# Patient Record
Sex: Male | Born: 1980
Health system: Southern US, Community
[De-identification: ages and names within clinical notes are randomized; demographics above are authoritative.]

## PROBLEM LIST (undated history)

## (undated) DIAGNOSIS — G4700 Insomnia, unspecified: Secondary | ICD-10-CM

## (undated) DIAGNOSIS — F909 Attention-deficit hyperactivity disorder, unspecified type: Secondary | ICD-10-CM

## (undated) DIAGNOSIS — I10 Essential (primary) hypertension: Secondary | ICD-10-CM

## (undated) DIAGNOSIS — E785 Hyperlipidemia, unspecified: Secondary | ICD-10-CM

## (undated) DIAGNOSIS — F329 Major depressive disorder, single episode, unspecified: Secondary | ICD-10-CM

## (undated) DIAGNOSIS — F419 Anxiety disorder, unspecified: Secondary | ICD-10-CM

## (undated) DIAGNOSIS — F32A Depression, unspecified: Secondary | ICD-10-CM

## (undated) DIAGNOSIS — S82891A Other fracture of right lower leg, initial encounter for closed fracture: Secondary | ICD-10-CM

## (undated) HISTORY — PX: ORIF CLAVICLE FRACTURE: SUR924

## (undated) HISTORY — DX: Essential (primary) hypertension: I10

## (undated) HISTORY — DX: Insomnia, unspecified: G47.00

## (undated) HISTORY — DX: Anxiety disorder, unspecified: F41.9

## (undated) HISTORY — DX: Attention-deficit hyperactivity disorder, unspecified type: F90.9

## (undated) HISTORY — DX: Major depressive disorder, single episode, unspecified: F32.9

## (undated) HISTORY — DX: Hyperlipidemia, unspecified: E78.5

## (undated) HISTORY — DX: Depression, unspecified: F32.A

---

## 2003-01-17 ENCOUNTER — Encounter: Payer: Self-pay | Admitting: Family Medicine

## 2003-01-17 ENCOUNTER — Encounter: Admission: RE | Admit: 2003-01-17 | Discharge: 2003-01-17 | Payer: Self-pay | Admitting: Family Medicine

## 2005-06-21 ENCOUNTER — Emergency Department (HOSPITAL_COMMUNITY): Admission: EM | Admit: 2005-06-21 | Discharge: 2005-06-21 | Payer: Self-pay | Admitting: Emergency Medicine

## 2010-12-09 ENCOUNTER — Emergency Department (HOSPITAL_COMMUNITY)
Admission: EM | Admit: 2010-12-09 | Discharge: 2010-12-09 | Disposition: A | Discharge status: Home / Self Care | Payer: Worker's Compensation | Attending: Emergency Medicine | Admitting: Emergency Medicine

## 2010-12-09 DIAGNOSIS — W261XXA Contact with sword or dagger, initial encounter: Secondary | ICD-10-CM | POA: Insufficient documentation

## 2010-12-09 DIAGNOSIS — S61209A Unspecified open wound of unspecified finger without damage to nail, initial encounter: Secondary | ICD-10-CM | POA: Insufficient documentation

## 2010-12-09 DIAGNOSIS — W260XXA Contact with knife, initial encounter: Secondary | ICD-10-CM | POA: Insufficient documentation

## 2012-09-06 ENCOUNTER — Other Ambulatory Visit: Payer: Self-pay | Admitting: Family Medicine

## 2012-09-06 DIAGNOSIS — R1011 Right upper quadrant pain: Secondary | ICD-10-CM

## 2012-09-10 ENCOUNTER — Ambulatory Visit
Admission: RE | Admit: 2012-09-10 | Discharge: 2012-09-10 | Disposition: A | Discharge status: Home / Self Care | Payer: 59 | Source: Ambulatory Visit | Attending: Family Medicine | Admitting: Family Medicine

## 2012-09-10 DIAGNOSIS — R1011 Right upper quadrant pain: Secondary | ICD-10-CM

## 2014-06-01 ENCOUNTER — Emergency Department: Payer: Self-pay | Admitting: Emergency Medicine

## 2014-06-01 LAB — CBC WITH DIFFERENTIAL/PLATELET
BASOS PCT: 0.4 %
Basophil #: 0.1 10*3/uL (ref 0.0–0.1)
Eosinophil #: 0.1 10*3/uL (ref 0.0–0.7)
Eosinophil %: 0.5 %
HCT: 43 % (ref 40.0–52.0)
HGB: 14.4 g/dL (ref 13.0–18.0)
LYMPHS PCT: 7.2 %
Lymphocyte #: 1.6 10*3/uL (ref 1.0–3.6)
MCH: 30.1 pg (ref 26.0–34.0)
MCHC: 33.6 g/dL (ref 32.0–36.0)
MCV: 90 fL (ref 80–100)
MONOS PCT: 9 %
Monocyte #: 2 x10 3/mm — ABNORMAL HIGH (ref 0.2–1.0)
NEUTROS PCT: 82.9 %
Neutrophil #: 18 10*3/uL — ABNORMAL HIGH (ref 1.4–6.5)
PLATELETS: 201 10*3/uL (ref 150–440)
RBC: 4.79 10*6/uL (ref 4.40–5.90)
RDW: 12 % (ref 11.5–14.5)
WBC: 21.8 10*3/uL — AB (ref 3.8–10.6)

## 2014-06-01 LAB — BASIC METABOLIC PANEL
ANION GAP: 8 (ref 7–16)
BUN: 16 mg/dL (ref 7–18)
CALCIUM: 8.8 mg/dL (ref 8.5–10.1)
CREATININE: 1.12 mg/dL (ref 0.60–1.30)
Chloride: 102 mmol/L (ref 98–107)
Co2: 27 mmol/L (ref 21–32)
GLUCOSE: 114 mg/dL — AB (ref 65–99)
Osmolality: 276 (ref 275–301)
Potassium: 3.9 mmol/L (ref 3.5–5.1)
SODIUM: 137 mmol/L (ref 136–145)

## 2014-06-01 LAB — ED INFLUENZA
H1N1 flu by pcr: NOT DETECTED
INFLBPCR: NEGATIVE
Influenza A By PCR: NEGATIVE

## 2014-06-01 LAB — URINALYSIS, COMPLETE
Bacteria: NONE SEEN
Bilirubin,UR: NEGATIVE
Blood: NEGATIVE
Glucose,UR: NEGATIVE mg/dL (ref 0–75)
KETONE: NEGATIVE
LEUKOCYTE ESTERASE: NEGATIVE
Nitrite: NEGATIVE
PH: 5 (ref 4.5–8.0)
Protein: NEGATIVE
RBC,UR: <1 /HPF (ref 0–5)
SPECIFIC GRAVITY: 1.023 (ref 1.003–1.030)
Squamous Epithelial: NONE SEEN
WBC UR: 1 /HPF (ref 0–5)

## 2014-06-01 LAB — MONONUCLEOSIS SCREEN: MONO TEST: POSITIVE

## 2014-06-01 LAB — CK: CK, Total: 99 U/L (ref 39–308)

## 2014-06-01 LAB — TROPONIN I

## 2014-06-03 LAB — BETA STREP CULTURE(ARMC)

## 2014-06-06 LAB — CULTURE, BLOOD (SINGLE)

## 2015-05-17 ENCOUNTER — Emergency Department
Admission: EM | Admit: 2015-05-17 | Discharge: 2015-05-17 | Disposition: A | Discharge status: Home / Self Care | Payer: 59 | Attending: Emergency Medicine | Admitting: Emergency Medicine

## 2015-05-17 ENCOUNTER — Encounter: Payer: Self-pay | Admitting: Emergency Medicine

## 2015-05-17 DIAGNOSIS — S61012A Laceration without foreign body of left thumb without damage to nail, initial encounter: Secondary | ICD-10-CM | POA: Diagnosis present

## 2015-05-17 DIAGNOSIS — Y9389 Activity, other specified: Secondary | ICD-10-CM | POA: Diagnosis not present

## 2015-05-17 DIAGNOSIS — Y288XXA Contact with other sharp object, undetermined intent, initial encounter: Secondary | ICD-10-CM | POA: Insufficient documentation

## 2015-05-17 DIAGNOSIS — Y9289 Other specified places as the place of occurrence of the external cause: Secondary | ICD-10-CM | POA: Diagnosis not present

## 2015-05-17 DIAGNOSIS — Y998 Other external cause status: Secondary | ICD-10-CM | POA: Insufficient documentation

## 2015-05-17 MED ORDER — BACITRACIN-NEOMYCIN-POLYMYXIN 400-5-5000 EX OINT
TOPICAL_OINTMENT | CUTANEOUS | Status: AC
  Filled 2015-05-17: qty 1

## 2015-05-17 MED ORDER — LIDOCAINE HCL (PF) 1 % IJ SOLN
INTRAMUSCULAR | Status: AC
  Filled 2015-05-17: qty 5

## 2015-05-17 MED ORDER — TRIPLE ANTIBIOTIC 3.5-400-5000 EX OINT
TOPICAL_OINTMENT | Freq: Once | CUTANEOUS | Status: AC
  Administered 2015-05-17: 16:00:00 via TOPICAL

## 2015-05-17 MED ORDER — OXYCODONE-ACETAMINOPHEN 7.5-325 MG PO TABS: 1.0000 | ORAL_TABLET | Freq: Four times a day (QID) | ORAL | Status: DC | PRN

## 2015-05-17 NOTE — Discharge Instructions (Signed)
Head sutures removed by family doctor return back to the ER 10 days. Laceration Care, Adult A laceration is a cut that goes through all layers of the skin. The cut also goes into the tissue that is right under the skin. Some cuts heal on their own. Others need to be closed with stitches (sutures), staples, skin adhesive strips, or wound glue. Taking care of your cut lowers your risk of infection and helps your cut to heal better. HOW TO TAKE CARE OF YOUR CUT For stitches or staples:  Keep the wound clean and dry.  If you were given a bandage (dressing), you should change it at least one time per day or as told by your doctor. You should also change it if it gets wet or dirty.  Keep the wound completely dry for the first 24 hours or as told by your doctor. After that time, you may take a shower or a bath. However, make sure that the wound is not soaked in water until after the stitches or staples have been removed.  Clean the wound one time each day or as told by your doctor:  Wash the wound with soap and water.  Rinse the wound with water until all of the soap comes off.  Pat the wound dry with a clean towel. Do not rub the wound.  After you clean the wound, put a thin layer of antibiotic ointment on it as told by your doctor. This ointment:  Helps to prevent infection.  Keeps the bandage from sticking to the wound.  Have your stitches or staples removed as told by your doctor. If your doctor used skin adhesive strips:   Keep the wound clean and dry.  If you were given a bandage, you should change it at least one time per day or as told by your doctor. You should also change it if it gets dirty or wet.  Do not get the skin adhesive strips wet. You can take a shower or a bath, but be careful to keep the wound dry.  If the wound gets wet, pat it dry with a clean towel. Do not rub the wound.  Skin adhesive strips fall off on their own. You can trim the strips as the wound heals. Do  not remove any strips that are still stuck to the wound. They will fall off after a while. If your doctor used wound glue:  Try to keep your wound dry, but you may briefly wet it in the shower or bath. Do not soak the wound in water, such as by swimming.  After you take a shower or a bath, gently pat the wound dry with a clean towel. Do not rub the wound.  Do not do any activities that will make you really sweaty until the skin glue has fallen off on its own.  Do not apply liquid, cream, or ointment medicine to your wound while the skin glue is still on.  If you were given a bandage, you should change it at least one time per day or as told by your doctor. You should also change it if it gets dirty or wet.  If a bandage is placed over the wound, do not let the tape for the bandage touch the skin glue.  Do not pick at the glue. The skin glue usually stays on for 5-10 days. Then, it falls off of the skin. General Instructions  To help prevent scarring, make sure to cover your wound with sunscreen  whenever you are outside after stitches are removed, after adhesive strips are removed, or when wound glue stays in place and the wound is healed. Make sure to wear a sunscreen of at least 30 SPF.  Take over-the-counter and prescription medicines only as told by your doctor.  If you were given antibiotic medicine or ointment, take or apply it as told by your doctor. Do not stop using the antibiotic even if your wound is getting better.  Do not scratch or pick at the wound.  Keep all follow-up visits as told by your doctor. This is important.  Check your wound every day for signs of infection. Watch for:  Redness, swelling, or pain.  Fluid, blood, or pus.  Raise (elevate) the injured area above the level of your heart while you are sitting or lying down, if possible. GET HELP IF:  You got a tetanus shot and you have any of these problems at the injection site:  Swelling.  Very bad  pain.  Redness.  Bleeding.  You have a fever.  A wound that was closed breaks open.  You notice a bad smell coming from your wound or your bandage.  You notice something coming out of the wound, such as wood or glass.  Medicine does not help your pain.  You have more redness, swelling, or pain at the site of your wound.  You have fluid, blood, or pus coming from your wound.  You notice a change in the color of your skin near your wound.  You need to change the bandage often because fluid, blood, or pus is coming from the wound.  You start to have a new rash.  You start to have numbness around the wound. GET HELP RIGHT AWAY IF:  You have very bad swelling around the wound.  Your pain suddenly gets worse and is very bad.  You notice painful lumps near the wound or on skin that is anywhere on your body.  You have a red streak going away from your wound.  The wound is on your hand or foot and you cannot move a finger or toe like you usually can.  The wound is on your hand or foot and you notice that your fingers or toes look pale or bluish.   This information is not intended to replace advice given to you by your health care provider. Make sure you discuss any questions you have with your health care provider.   Document Released: 12/07/2007 Document Revised: 11/04/2014 Document Reviewed: 06/16/2014 Elsevier Interactive Patient Education [employer].

## 2015-05-17 NOTE — ED Notes (Signed)
Pt presents with laceration from left tip of thumb towards bottom of nail bed. No active bleeding at present.

## 2015-05-17 NOTE — ED Provider Notes (Signed)
[facility] Emergency Department Provider Note  ____________________________________________  Time seen: Approximately 3:55 PM  I have reviewed the triage vital signs and the nursing notes.   HISTORY  Chief Complaint Laceration    HPI Seth [name] is a 34 y.o. male patient presented with lacerations to left thumb was cut by a razor. He which is controlled direct pressure. Patient denies any loss sensation or loss of function of the left thumb. Patient is right-hand dominant. Pressure dressing was applied prior to arrival. Patient rates his pain discomfort as 8/10.   History reviewed. No pertinent past medical history.  There are no active problems to display for this patient.   History reviewed. No pertinent past surgical history.  Current Outpatient Rx  Name  Route  Sig  Dispense  Refill  . oxyCODONE-acetaminophen (PERCOCET) 7.5-325 MG tablet   Oral   Take 1 tablet by mouth every 6 (six) hours as needed for severe pain.   12 tablet   0     Allergies Review of patient's allergies indicates no known allergies.  No family history on file.  Social History Social History  Substance Use Topics  . Smoking status: Never Smoker   . Smokeless tobacco: None  . Alcohol Use: Yes    Review of Systems Constitutional: No fever/chills Eyes: No visual changes. ENT: No sore throat. Cardiovascular: Denies chest pain. Respiratory: Denies shortness of breath. Gastrointestinal: No abdominal pain.  No nausea, no vomiting.  No diarrhea.  No constipation. Genitourinary: Negative for dysuria. Musculoskeletal: Negative for back pain. Skin: Negative for rash. Laceration distal aspect of the left thumb. Neurological: Negative for headaches, focal weakness or numbness. 10-point ROS otherwise negative.  ____________________________________________   PHYSICAL EXAM:  VITAL SIGNS: ED Triage Vitals  Enc Vitals Group     BP 05/17/15 1340 157/108 mmHg   Pulse Rate 05/17/15 1340 73     Resp 05/17/15 1340 18     Temp 05/17/15 1340 97.3 F (36.3 C)     Temp Source 05/17/15 1340 Oral     SpO2 05/17/15 1340 95 %     Weight 05/17/15 1340 205 lb (92.987 kg)     Height 05/17/15 1340 6\' 1"  (1.854 m)     Head Cir --      Peak Flow --      Pain Score 05/17/15 1338 8     Pain Loc --      Pain Edu? --      Excl. in [place]? --     Constitutional: Alert and oriented. Well appearing and in no acute distress. Eyes: Conjunctivae are normal. PERRL. EOMI. Head: Atraumatic. Nose: No congestion/rhinnorhea. Mouth/Throat: Mucous membranes are moist.  Oropharynx non-erythematous. Neck: No stridor.  No cervical spine tenderness to palpation. Hematological/Lymphatic/Immunilogical: No cervical lymphadenopathy. Cardiovascular: Normal rate, regular rhythm. Grossly normal heart sounds.  Good peripheral circulation. Respiratory: Normal respiratory effort.  No retractions. Lungs CTAB. Gastrointestinal: Soft and nontender. No distention. No abdominal bruits. No CVA tenderness. Musculoskeletal: No lower extremity tenderness nor edema.  No joint effusions. Neurologic:  Normal speech and language. No gross focal neurologic deficits are appreciated. No gait instability. Skin:  Skin is warm, dry and intact. No rash noted. 1 cm laceration distal left thumb. Psychiatric: Mood and affect are normal. Speech and behavior are normal.  ____________________________________________   LABS (all labs ordered are listed, but only abnormal results are displayed)  Labs Reviewed - No data to display ____________________________________________  EKG   ____________________________________________  RADIOLOGY  ____________________________________________   PROCEDURES  Procedure(s) performed: See procedure note  Critical Care performed: No  ____________________________________________   INITIAL IMPRESSION / ASSESSMENT AND PLAN / ED COURSE  Pertinent labs & imaging  results that were available during my care of the patient were reviewed by me and considered in my medical decision making (see chart for details).  Laceration left thumb. Area was close with sutures. Patient advised on home care. Patient advised to have sutures removed in 10 days. Patient given Percocet as needed for pain. Return to ER if condition worsens. ____________________________________________   FINAL CLINICAL IMPRESSION(S) / ED DIAGNOSES  Final diagnoses:  Thumb laceration, left, initial encounter      Sable Feil, PA-C 05/17/15 1600  Ahmed Prima, MD 05/17/15 2242

## 2015-05-17 NOTE — ED Notes (Signed)
Pt states he cut left thumb with razor blade, pt has bandage in place at present

## 2015-12-05 ENCOUNTER — Encounter (HOSPITAL_COMMUNITY): Payer: Self-pay | Admitting: Emergency Medicine

## 2015-12-05 ENCOUNTER — Emergency Department (HOSPITAL_COMMUNITY): Payer: Worker's Compensation

## 2015-12-05 ENCOUNTER — Emergency Department (HOSPITAL_COMMUNITY)
Admission: EM | Admit: 2015-12-05 | Discharge: 2015-12-05 | Disposition: A | Discharge status: Home / Self Care | Payer: Worker's Compensation | Attending: Emergency Medicine | Admitting: Emergency Medicine

## 2015-12-05 DIAGNOSIS — Y9289 Other specified places as the place of occurrence of the external cause: Secondary | ICD-10-CM | POA: Diagnosis not present

## 2015-12-05 DIAGNOSIS — S82431A Displaced oblique fracture of shaft of right fibula, initial encounter for closed fracture: Secondary | ICD-10-CM | POA: Insufficient documentation

## 2015-12-05 DIAGNOSIS — W1839XA Other fall on same level, initial encounter: Secondary | ICD-10-CM | POA: Diagnosis not present

## 2015-12-05 DIAGNOSIS — Y9301 Activity, walking, marching and hiking: Secondary | ICD-10-CM | POA: Insufficient documentation

## 2015-12-05 DIAGNOSIS — S82401A Unspecified fracture of shaft of right fibula, initial encounter for closed fracture: Secondary | ICD-10-CM

## 2015-12-05 DIAGNOSIS — S99911A Unspecified injury of right ankle, initial encounter: Secondary | ICD-10-CM | POA: Diagnosis present

## 2015-12-05 DIAGNOSIS — Y998 Other external cause status: Secondary | ICD-10-CM | POA: Insufficient documentation

## 2015-12-05 DIAGNOSIS — S82891A Other fracture of right lower leg, initial encounter for closed fracture: Secondary | ICD-10-CM

## 2015-12-05 HISTORY — DX: Other fracture of right lower leg, initial encounter for closed fracture: S82.891A

## 2015-12-05 MED ORDER — OXYCODONE-ACETAMINOPHEN 5-325 MG PO TABS
1.0000 | ORAL_TABLET | ORAL | Status: DC | PRN
  Administered 2015-12-05: 1 via ORAL

## 2015-12-05 MED ORDER — OXYCODONE-ACETAMINOPHEN 5-325 MG PO TABS: 1.0000 | ORAL_TABLET | Freq: Four times a day (QID) | ORAL | Status: DC | PRN

## 2015-12-05 MED ORDER — OXYCODONE-ACETAMINOPHEN 5-325 MG PO TABS
ORAL_TABLET | ORAL | Status: AC
  Filled 2015-12-05: qty 1
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
## 2015-12-05 NOTE — Discharge Instructions (Signed)
You have a minimally displaced fibular fracture.  You have been placed in a Cam Walker.  This should be 1 at anytime you are up ambulating .  You can take the Cam Walker off to sleep and to bathe  You have been given a prescription for Percocet that you can take for severe pain.  He can supplement this with ibuprofen or additional Tylenol just be careful adding Tylenol .  He do not want to take more than 3 g in 24 hours each Percocet has 325 mg over-the-counter Tylenol is 325 mg each day sure to see a orthopedic specialist within the next week to help monitor healing   Fibular Ankle Fracture Treated With or Without Immobilization, Adult A fibular fracture at your ankle is a break (fracture) bone in the smallest of the two bones in your lower leg, located on the outside of your leg (fibula) close to the area at your ankle joint. CAUSES  Rolling your ankle.  Twisting your ankle.  Extreme flexing or extending of your foot.  Severe force on your ankle as when falling from a distance. RISK FACTORS  Jumping activities.  Participation in sports.  Osteoporosis.  Advanced age.  Previous ankle injuries. SIGNS AND SYMPTOMS  Pain.  Swelling.  Inability to put weight on injured ankle.  Bruising.  Bone deformities at site of injury. DIAGNOSIS  This fracture is diagnosed with the help of an X-ray exam. TREATMENT  If the fractured bone did not move out of place it usually will heal without problems and does casting or splinting. If immobilization is needed for comfort or the fractured bone moved out of place and will not heal properly with immobilization, a cast or splint will be used. HOME CARE INSTRUCTIONS   Apply ice to the area of injury:  Put ice in a plastic bag.  Place a towel between your skin and the bag.  Leave the ice on for 20 minutes, 2-3 times a day.  Use crutches as directed. Resume walking without crutches as directed by your health care provider.  Only take  over-the-counter or prescription medicines for pain, discomfort, or fever as directed by your health care provider.  If you have a removable splint or boot, do not remove the boot unless directed by your health care provider. SEEK MEDICAL CARE IF:   You have continued pain or more swelling  The medications do not control the pain. SEEK IMMEDIATE MEDICAL CARE IF:  You develop severe pain in the leg or foot.  Your skin or nails below the injury turn blue or grey or feel cold or numb. MAKE SURE YOU:   Understand these instructions.  Will watch your condition.  Will get help right away if you are not doing well or get worse.   This information is not intended to replace advice given to you by your health care provider. Make sure you discuss any questions you have with your health care provider.   Document Released: 06/20/2005 Document Revised: 07/11/2014 Document Reviewed: 01/30/2013 Elsevier Interactive Patient Education [employer].
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
## 2015-12-11 ENCOUNTER — Encounter (HOSPITAL_BASED_OUTPATIENT_CLINIC_OR_DEPARTMENT_OTHER): Payer: Self-pay | Admitting: *Deleted

## 2015-12-11 ENCOUNTER — Other Ambulatory Visit: Payer: Self-pay | Admitting: Orthopaedic Surgery

## 2015-12-16 ENCOUNTER — Ambulatory Visit (HOSPITAL_BASED_OUTPATIENT_CLINIC_OR_DEPARTMENT_OTHER)
Admission: RE | Admit: 2015-12-16 | Discharge: 2015-12-16 | Disposition: A | Discharge status: Home / Self Care | Payer: Worker's Compensation | Source: Ambulatory Visit | Attending: Orthopaedic Surgery | Admitting: Orthopaedic Surgery

## 2015-12-16 ENCOUNTER — Ambulatory Visit (HOSPITAL_COMMUNITY): Payer: Worker's Compensation

## 2015-12-16 ENCOUNTER — Encounter (HOSPITAL_BASED_OUTPATIENT_CLINIC_OR_DEPARTMENT_OTHER): Payer: Self-pay | Admitting: Certified Registered"

## 2015-12-16 ENCOUNTER — Ambulatory Visit (HOSPITAL_BASED_OUTPATIENT_CLINIC_OR_DEPARTMENT_OTHER): Payer: Worker's Compensation | Admitting: Certified Registered"

## 2015-12-16 ENCOUNTER — Encounter (HOSPITAL_BASED_OUTPATIENT_CLINIC_OR_DEPARTMENT_OTHER)
Admission: RE | Disposition: A | Discharge status: Home / Self Care | Payer: Self-pay | Source: Ambulatory Visit | Attending: Orthopaedic Surgery

## 2015-12-16 DIAGNOSIS — T148XXA Other injury of unspecified body region, initial encounter: Secondary | ICD-10-CM

## 2015-12-16 DIAGNOSIS — S8261XA Displaced fracture of lateral malleolus of right fibula, initial encounter for closed fracture: Secondary | ICD-10-CM | POA: Diagnosis not present

## 2015-12-16 DIAGNOSIS — Z886 Allergy status to analgesic agent status: Secondary | ICD-10-CM | POA: Insufficient documentation

## 2015-12-16 HISTORY — DX: Other fracture of right lower leg, initial encounter for closed fracture: S82.891A

## 2015-12-16 HISTORY — PX: ORIF ANKLE FRACTURE: SHX5408

## 2015-12-16 SURGERY — OPEN REDUCTION INTERNAL FIXATION (ORIF) ANKLE FRACTURE
Anesthesia: General | Site: Ankle | Laterality: Right

## 2015-12-16 MED ORDER — LIDOCAINE HCL (CARDIAC) 20 MG/ML IV SOLN
INTRAVENOUS | Status: DC | PRN
  Administered 2015-12-16: 30 mg via INTRAVENOUS

## 2015-12-16 MED ORDER — 0.9 % SODIUM CHLORIDE (POUR BTL) OPTIME
TOPICAL | Status: DC | PRN
  Administered 2015-12-16: 1000 mL

## 2015-12-16 MED ORDER — FENTANYL CITRATE (PF) 100 MCG/2ML IJ SOLN
50.0000 ug | INTRAMUSCULAR | Status: DC | PRN
  Administered 2015-12-16: 100 ug via INTRAVENOUS

## 2015-12-16 MED ORDER — LIDOCAINE 2% (20 MG/ML) 5 ML SYRINGE
INTRAMUSCULAR | Status: AC
  Filled 2015-12-16: qty 5

## 2015-12-16 MED ORDER — MEPERIDINE HCL 25 MG/ML IJ SOLN: 6.2500 mg | INTRAMUSCULAR | Status: DC | PRN

## 2015-12-16 MED ORDER — OXYCODONE-ACETAMINOPHEN 5-325 MG PO TABS: 1.0000 | ORAL_TABLET | ORAL | Status: DC | PRN

## 2015-12-16 MED ORDER — DEXAMETHASONE SODIUM PHOSPHATE 10 MG/ML IJ SOLN
INTRAMUSCULAR | Status: DC | PRN
  Administered 2015-12-16: 10 mg via INTRAVENOUS

## 2015-12-16 MED ORDER — MIDAZOLAM HCL 2 MG/2ML IJ SOLN
INTRAMUSCULAR | Status: AC
  Filled 2015-12-16: qty 2

## 2015-12-16 MED ORDER — ARTIFICIAL TEARS OP OINT
TOPICAL_OINTMENT | OPHTHALMIC | Status: AC
  Filled 2015-12-16: qty 3.5

## 2015-12-16 MED ORDER — ASPIRIN EC 325 MG PO TBEC
325.0000 mg | DELAYED_RELEASE_TABLET | Freq: Two times a day (BID) | ORAL | Status: DC
Start: 2015-12-16 — End: 2016-06-08

## 2015-12-16 MED ORDER — OXYCODONE HCL 5 MG PO TABS
ORAL_TABLET | ORAL | Status: AC
  Filled 2015-12-16: qty 1

## 2015-12-16 MED ORDER — BUPIVACAINE-EPINEPHRINE (PF) 0.5% -1:200000 IJ SOLN
INTRAMUSCULAR | Status: DC | PRN
  Administered 2015-12-16: 30 mL via PERINEURAL

## 2015-12-16 MED ORDER — GLYCOPYRROLATE 0.2 MG/ML IJ SOLN: 0.2000 mg | Freq: Once | INTRAMUSCULAR | Status: DC | PRN

## 2015-12-16 MED ORDER — DEXAMETHASONE SODIUM PHOSPHATE 10 MG/ML IJ SOLN
INTRAMUSCULAR | Status: AC
  Filled 2015-12-16: qty 1

## 2015-12-16 MED ORDER — HYDROMORPHONE HCL 1 MG/ML IJ SOLN
INTRAMUSCULAR | Status: AC
  Filled 2015-12-16: qty 1

## 2015-12-16 MED ORDER — ONDANSETRON HCL 4 MG/2ML IJ SOLN
INTRAMUSCULAR | Status: DC | PRN
  Administered 2015-12-16: 4 mg via INTRAVENOUS

## 2015-12-16 MED ORDER — PROPOFOL 500 MG/50ML IV EMUL
INTRAVENOUS | Status: AC
  Filled 2015-12-16: qty 50

## 2015-12-16 MED ORDER — CEFAZOLIN SODIUM-DEXTROSE 2-4 GM/100ML-% IV SOLN
INTRAVENOUS | Status: AC
  Filled 2015-12-16: qty 100

## 2015-12-16 MED ORDER — PROPOFOL 10 MG/ML IV BOLUS
INTRAVENOUS | Status: DC | PRN
  Administered 2015-12-16: 200 mg via INTRAVENOUS

## 2015-12-16 MED ORDER — SENNOSIDES-DOCUSATE SODIUM 8.6-50 MG PO TABS: 1.0000 | ORAL_TABLET | Freq: Every evening | ORAL | Status: DC | PRN

## 2015-12-16 MED ORDER — LACTATED RINGERS IV SOLN
INTRAVENOUS | Status: DC
  Administered 2015-12-16 (×2): via INTRAVENOUS

## 2015-12-16 MED ORDER — CEFAZOLIN SODIUM-DEXTROSE 2-4 GM/100ML-% IV SOLN
2.0000 g | INTRAVENOUS | Status: AC
Start: 2015-12-16 — End: 2015-12-16
  Administered 2015-12-16: 2 g via INTRAVENOUS

## 2015-12-16 MED ORDER — OXYCODONE HCL 5 MG PO TABS
5.0000 mg | ORAL_TABLET | Freq: Once | ORAL | Status: AC | PRN
  Administered 2015-12-16: 5 mg via ORAL

## 2015-12-16 MED ORDER — HYDROMORPHONE HCL 1 MG/ML IJ SOLN
0.2500 mg | INTRAMUSCULAR | Status: DC | PRN
  Administered 2015-12-16 (×2): 0.5 mg via INTRAVENOUS

## 2015-12-16 MED ORDER — OXYCODONE HCL 5 MG/5ML PO SOLN: 5.0000 mg | Freq: Once | ORAL | Status: AC | PRN

## 2015-12-16 MED ORDER — FENTANYL CITRATE (PF) 100 MCG/2ML IJ SOLN
INTRAMUSCULAR | Status: AC
  Filled 2015-12-16: qty 2

## 2015-12-16 MED ORDER — METHOCARBAMOL 750 MG PO TABS: 750.0000 mg | ORAL_TABLET | Freq: Two times a day (BID) | ORAL | Status: DC | PRN

## 2015-12-16 MED ORDER — MIDAZOLAM HCL 2 MG/2ML IJ SOLN
1.0000 mg | INTRAMUSCULAR | Status: DC | PRN
  Administered 2015-12-16 (×2): 2 mg via INTRAVENOUS

## 2015-12-16 MED ORDER — SCOPOLAMINE 1 MG/3DAYS TD PT72: 1.0000 | MEDICATED_PATCH | Freq: Once | TRANSDERMAL | Status: DC | PRN

## 2015-12-16 MED ORDER — ONDANSETRON HCL 4 MG/2ML IJ SOLN
INTRAMUSCULAR | Status: AC
  Filled 2015-12-16: qty 2

## 2015-12-16 SURGICAL SUPPLY — 76 items
BANDAGE ACE 4X5 VEL STRL LF (GAUZE/BANDAGES/DRESSINGS) IMPLANT
BANDAGE ACE 6X5 VEL STRL LF (GAUZE/BANDAGES/DRESSINGS) ×2 IMPLANT
BANDAGE ESMARK 6X9 LF (GAUZE/BANDAGES/DRESSINGS) ×1 IMPLANT
BLADE HEX COATED 2.75 (ELECTRODE) ×2 IMPLANT
BLADE SURG 15 STRL LF DISP TIS (BLADE) ×2 IMPLANT
BLADE SURG 15 STRL SS (BLADE) ×4
BNDG CMPR 9X6 STRL LF SNTH (GAUZE/BANDAGES/DRESSINGS) ×1
BNDG COHESIVE 6X5 TAN STRL LF (GAUZE/BANDAGES/DRESSINGS) ×2 IMPLANT
BNDG ESMARK 6X9 LF (GAUZE/BANDAGES/DRESSINGS) ×2
CANISTER SUCT 1200ML W/VALVE (MISCELLANEOUS) ×2 IMPLANT
COVER BACK TABLE 60X90IN (DRAPES) ×2 IMPLANT
CUFF TOURNIQUET SINGLE 34IN LL (TOURNIQUET CUFF) ×1 IMPLANT
DECANTER SPIKE VIAL GLASS SM (MISCELLANEOUS) IMPLANT
DRAPE C-ARM 42X72 X-RAY (DRAPES) ×2 IMPLANT
DRAPE C-ARMOR (DRAPES) ×2 IMPLANT
DRAPE EXTREMITY T 121X128X90 (DRAPE) ×2 IMPLANT
DRAPE IMP U-DRAPE 54X76 (DRAPES) ×2 IMPLANT
DRAPE SURG 17X23 STRL (DRAPES) ×4 IMPLANT
DRILL 2.6X122MM WL AO SHAFT (BIT) ×1 IMPLANT
DRSG PAD ABDOMINAL 8X10 ST (GAUZE/BANDAGES/DRESSINGS) ×4 IMPLANT
DURAPREP 26ML APPLICATOR (WOUND CARE) ×4 IMPLANT
ELECT REM PT RETURN 9FT ADLT (ELECTROSURGICAL) ×2
ELECTRODE REM PT RTRN 9FT ADLT (ELECTROSURGICAL) ×1 IMPLANT
GAUZE SPONGE 4X4 12PLY STRL (GAUZE/BANDAGES/DRESSINGS) ×2 IMPLANT
GAUZE XEROFORM 1X8 LF (GAUZE/BANDAGES/DRESSINGS) ×2 IMPLANT
GLOVE BIOGEL PI IND STRL 7.0 (GLOVE) IMPLANT
GLOVE BIOGEL PI IND STRL 7.5 (GLOVE) IMPLANT
GLOVE BIOGEL PI INDICATOR 7.0 (GLOVE) ×1
GLOVE BIOGEL PI INDICATOR 7.5 (GLOVE) ×1
GLOVE ECLIPSE 6.5 STRL STRAW (GLOVE) ×1 IMPLANT
GLOVE SKINSENSE NS SZ7.5 (GLOVE) ×1
GLOVE SKINSENSE STRL SZ7.5 (GLOVE) ×1 IMPLANT
GLOVE SURG SYN 7.5  E (GLOVE) ×1
GLOVE SURG SYN 7.5 E (GLOVE) ×1 IMPLANT
GLOVE SURG SYN 7.5 PF PI (GLOVE) ×1 IMPLANT
GOWN STRL REIN XL XLG (GOWN DISPOSABLE) ×2 IMPLANT
GOWN STRL REUS W/ TWL LRG LVL3 (GOWN DISPOSABLE) ×1 IMPLANT
GOWN STRL REUS W/TWL LRG LVL3 (GOWN DISPOSABLE) ×2
K-WIRE ORTHOPEDIC 1.4X150L (WIRE) ×4
KWIRE ORTHOPEDIC 1.4X150L (WIRE) IMPLANT
NEEDLE HYPO 22GX1.5 SAFETY (NEEDLE) IMPLANT
NS IRRIG 1000ML POUR BTL (IV SOLUTION) ×2 IMPLANT
PACK BASIN DAY SURGERY FS (CUSTOM PROCEDURE TRAY) ×2 IMPLANT
PAD CAST 3X4 CTTN HI CHSV (CAST SUPPLIES) IMPLANT
PAD CAST 4YDX4 CTTN HI CHSV (CAST SUPPLIES) IMPLANT
PADDING CAST COTTON 3X4 STRL (CAST SUPPLIES)
PADDING CAST COTTON 4X4 STRL (CAST SUPPLIES)
PADDING CAST COTTON 6X4 STRL (CAST SUPPLIES) ×1 IMPLANT
PADDING CAST SYN 6 (CAST SUPPLIES)
PADDING CAST SYNTHETIC 4 (CAST SUPPLIES) ×2
PADDING CAST SYNTHETIC 4X4 STR (CAST SUPPLIES) IMPLANT
PADDING CAST SYNTHETIC 6X4 NS (CAST SUPPLIES) IMPLANT
PENCIL BUTTON HOLSTER BLD 10FT (ELECTRODE) ×2 IMPLANT
PLATE FIBULA 4H (Plate) ×1 IMPLANT
SCREW BONE 18 (Screw) ×3 IMPLANT
SCREW BONE 3.5X16MM (Screw) ×1 IMPLANT
SCREW BONE NON-LCKING 3.5X12MM (Screw) ×3 IMPLANT
SHEET MEDIUM DRAPE 40X70 STRL (DRAPES) ×2 IMPLANT
SLEEVE SCD COMPRESS KNEE MED (MISCELLANEOUS) ×2 IMPLANT
SPLINT FIBERGLASS 3X35 (CAST SUPPLIES) IMPLANT
SPLINT FIBERGLASS 4X30 (CAST SUPPLIES) ×1 IMPLANT
SPONGE LAP 18X18 X RAY DECT (DISPOSABLE) ×1 IMPLANT
SPONGE LAP 4X18 X RAY DECT (DISPOSABLE) ×2 IMPLANT
SUCTION FRAZIER HANDLE 10FR (MISCELLANEOUS) ×1
SUCTION TUBE FRAZIER 10FR DISP (MISCELLANEOUS) ×1 IMPLANT
SUT ETHILON 3 0 PS 1 (SUTURE) ×2 IMPLANT
SUT VIC AB 0 CT1 27 (SUTURE)
SUT VIC AB 0 CT1 27XBRD ANBCTR (SUTURE) IMPLANT
SUT VIC AB 2-0 CT1 27 (SUTURE) ×2
SUT VIC AB 2-0 CT1 TAPERPNT 27 (SUTURE) ×1 IMPLANT
SYR BULB 3OZ (MISCELLANEOUS) ×2 IMPLANT
SYR CONTROL 10ML LL (SYRINGE) IMPLANT
TOWEL OR 17X24 6PK STRL BLUE (TOWEL DISPOSABLE) ×2 IMPLANT
TUBE CONNECTING 20X1/4 (TUBING) ×2 IMPLANT
UNDERPAD 30X30 (UNDERPADS AND DIAPERS) ×2 IMPLANT
YANKAUER SUCT BULB TIP NO VENT (SUCTIONS) ×1 IMPLANT

## 2015-12-16 NOTE — Anesthesia Postprocedure Evaluation (Signed)
Anesthesia Post Note  Patient: Seth [name]  Procedure(s) Performed: Procedure(s) (LRB): OPEN REDUCTION INTERNAL FIXATION (ORIF) RIGHT ANKLE FRACTURE (Right)  Patient location during evaluation: PACU Anesthesia Type: General Level of consciousness: awake and alert Pain management: pain level controlled Vital Signs Assessment: post-procedure vital signs reviewed and stable Respiratory status: spontaneous breathing, nonlabored ventilation and respiratory function stable Cardiovascular status: blood pressure returned to baseline and stable Postop Assessment: no signs of nausea or vomiting Anesthetic complications: no    Last Vitals:  Filed Vitals:   12/16/15 1245 12/16/15 1315  BP: 150/95 148/85  Pulse: 76 65  Temp:  36.6 C  Resp: 16 16    Last Pain:  Filed Vitals:   12/16/15 1321  PainSc: 2                  Lorilei Horan A

## 2015-12-16 NOTE — Progress Notes (Signed)
Assisted Dr. Crews with right, ultrasound guided, popliteal block. Side rails up, monitors on throughout procedure. See vital signs in flow sheet. Tolerated Procedure well. 

## 2015-12-16 NOTE — Discharge Instructions (Signed)
° ° °  1. Keep splint clean and dry °2. Elevate foot above level of the heart °3. Take aspirin to prevent blood clots °4. Take pain meds as needed °5. Strict non weight bearing to operative extremity ° ° ° ° °Regional Anesthesia Blocks ° °1. Numbness or the inability to move the "blocked" extremity may last from 3-48 hours after placement. The length of time depends on the medication injected and your individual response to the medication. If the numbness is not going away after 48 hours, call your surgeon. ° °2. The extremity that is blocked will need to be protected until the numbness is gone and the  Strength has returned. Because you cannot feel it, you will need to take extra care to avoid injury. Because it may be weak, you may have difficulty moving it or using it. You may not know what position it is in without looking at it while the block is in effect. ° °3. For blocks in the legs and feet, returning to weight bearing and walking needs to be done carefully. You will need to wait until the numbness is entirely gone and the strength has returned. You should be able to move your leg and foot normally before you try and bear weight or walk. You will need someone to be with you when you first try to ensure you do not fall and possibly risk injury. ° °4. Bruising and tenderness at the needle site are common side effects and will resolve in a few days. ° °5. Persistent numbness or new problems with movement should be communicated to the surgeon or the [facility] ([phone])/ [facility] ([phone]). ° ° ° ° ° °Post Anesthesia Home Care Instructions ° °Activity: °Get plenty of rest for the remainder of the day. A responsible adult should stay with you for 24 hours following the procedure.  °For the next 24 hours, DO NOT: °-Drive a car °-Operate machinery °-Drink alcoholic beverages °-Take any medication unless instructed by your physician °-Make any legal decisions or sign important  papers. ° °Meals: °Start with liquid foods such as gelatin or soup. Progress to regular foods as tolerated. Avoid greasy, spicy, heavy foods. If nausea and/or vomiting occur, drink only clear liquids until the nausea and/or vomiting subsides. Call your physician if vomiting continues. ° °Special Instructions/Symptoms: °Your throat may feel dry or sore from the anesthesia or the breathing tube placed in your throat during surgery. If this causes discomfort, gargle with warm salt water. The discomfort should disappear within 24 hours. ° °If you had a scopolamine patch placed behind your ear for the management of post- operative nausea and/or vomiting: ° °1. The medication in the patch is effective for 72 hours, after which it should be removed.  Wrap patch in a tissue and discard in the trash. Wash hands thoroughly with soap and water. °2. You may remove the patch earlier than 72 hours if you experience unpleasant side effects which may include dry mouth, dizziness or visual disturbances. °3. Avoid touching the patch. Wash your hands with soap and water after contact with the patch. °  ° °

## 2015-12-16 NOTE — Anesthesia Preprocedure Evaluation (Addendum)
Anesthesia Evaluation  Patient identified by MRN, date of birth, ID band Patient awake    Reviewed: Allergy & Precautions, NPO status , Patient's Chart, lab work & pertinent test results  Airway Mallampati: I  TM Distance: >3 FB Neck ROM: Full    Dental  (+) Teeth Intact, Dental Advisory Given   Pulmonary    breath sounds clear to auscultation       Cardiovascular  Rhythm:Regular Rate:Normal     Neuro/Psych    GI/Hepatic   Endo/Other    Renal/GU      Musculoskeletal   Abdominal   Peds  Hematology   Anesthesia Other Findings   Reproductive/Obstetrics                            Anesthesia Physical Anesthesia Plan  ASA: I  Anesthesia Plan: General   Post-op Pain Management: GA combined w/ Regional for post-op pain   Induction: Intravenous  Airway Management Planned: LMA  Additional Equipment:   Intra-op Plan:   Post-operative Plan: Extubation in OR  Informed Consent: I have reviewed the patients History and Physical, chart, labs and discussed the procedure including the risks, benefits and alternatives for the proposed anesthesia with the patient or authorized representative who has indicated his/her understanding and acceptance.   Dental advisory given  Plan Discussed with: CRNA, Anesthesiologist and Surgeon  Anesthesia Plan Comments:         Anesthesia Quick Evaluation

## 2015-12-16 NOTE — Anesthesia Procedure Notes (Addendum)
Anesthesia Regional Block:  Popliteal block  Pre-Anesthetic Checklist: ,, timeout performed, Correct Patient, Correct Site, Correct Laterality, Correct Procedure, Correct Position, site marked, Risks and benefits discussed,  Surgical consent,  Pre-op evaluation,  At surgeon's request and post-op pain management  Laterality: Right and Lower  Prep: chloraprep       Needles:  Injection technique: Single-shot  Needle Type: Echogenic Needle     Needle Length: 9cm 9 cm Needle Gauge: 21 and 21 G    Additional Needles:  Procedures: ultrasound guided (picture in chart) Popliteal block Narrative:  Start time: 12/16/2015 10:28 AM End time: 12/16/2015 10:33 AM Injection made incrementally with aspirations every 5 mL.  Performed by: Personally  Anesthesiologist: CREWS, DAVID   Procedure Name: LMA Insertion Date/Time: 12/16/2015 10:55 AM Performed by: Dionisios Ricci D Pre-anesthesia Checklist: Patient identified, Emergency Drugs available, Suction available and Patient being monitored Patient Re-evaluated:Patient Re-evaluated prior to inductionOxygen Delivery Method: Circle system utilized Preoxygenation: Pre-oxygenation with 100% oxygen Intubation Type: IV induction Ventilation: Mask ventilation without difficulty LMA: LMA inserted LMA Size: 5.0 Number of attempts: 1 Airway Equipment and Method: Bite block Placement Confirmation: positive ETCO2 Tube secured with: Tape Dental Injury: Teeth and Oropharynx as per pre-operative assessment       Right Popliteal block image

## 2015-12-16 NOTE — H&P (Signed)

## 2015-12-16 NOTE — Op Note (Signed)
   Date of Surgery: 12/16/2015  INDICATIONS: Mr. Hathorn is a 35 y.o.-year-old male who sustained a right ankle fracture; he was indicated for open reduction and internal fixation due to the displaced nature of the articular fracture and came to the operating room today for this procedure. The patient did consent to the procedure after discussion of the risks and benefits.  PREOPERATIVE DIAGNOSIS: right lateral malleolus ankle fracture  POSTOPERATIVE DIAGNOSIS: Same.  PROCEDURE: Open treatment of right ankle fracture with internal fixation. Lateral malleolar CPT [phone]  SURGEON: N. Eduard Roux, M.D.  ASSIST: none.  ANESTHESIA:  general, regional  TOURNIQUET TIME: 41 mins  IV FLUIDS AND URINE: See anesthesia.  ESTIMATED BLOOD LOSS: minimal mL.  IMPLANTS: [employer] Variax 4 hole distal fibula plate  COMPLICATIONS: None.  DESCRIPTION OF PROCEDURE: The patient was brought to the operating room and placed supine on the operating table.  The patient had been signed prior to the procedure and this was documented. The patient had the anesthesia placed by the anesthesiologist.  A nonsterile tourniquet was placed on the upper thigh.  The prep verification and incision time-outs were performed to confirm that this was the correct patient, site, side and location. The patient had an SCD on the opposite lower extremity. The patient did receive antibiotics prior to the incision and was re-dosed during the procedure as needed at indicated intervals.  The patient had the lower extremity prepped and draped in the standard surgical fashion.  The extremity was exsanguinated using an esmarch bandage and the tourniquet was inflated to 300 mm Hg.  A lateral incision over the distal fibula was created. Blunt dissection was performed down to the fascia. The fascia was sharply incised in line with the incision. The periosteum was elevated to expose the fracture. Organized hematoma and entrapped periosteum were removed  from the fracture site. The fracture was reduced. A posterior to anterior lag screw was placed due to the orientation of the fracture. This was confirmed under fluoroscopy. We then placed a precontoured distal fibula plate on the lateral side of the fibula. Nonlocking screws were placed through the plate into the fibula both proximally and distally each giving excellent purchase. After this was done a stress test was then performed to confirm no widening of the medial clear space an intact syndesmosis. Final x-rays were taken. The wound was thoroughly irrigated and closed in layer fashion using 0 Vicryl, 2-0 Vicryl, 3-0 nylon. Sterile dressings were applied. Patient was immobilized in a short-leg splint. Patient tolerated the procedure well and no immediate complications.  POSTOPERATIVE PLAN: Mr. Harders will remain nonweightbearing on this leg for approximately 6 weeks; Mr. Diersen will return for suture removal in 2 weeks.  He will be immobilized in a short leg splint and then transitioned to a CAM walker at his first follow up appointment.  Mr. Litterio will receive DVT prophylaxis based on other medications, activity level, and risk ratio of bleeding to thrombosis.  Azucena Cecil, MD [place] [phone] 12:01 PM

## 2015-12-16 NOTE — Transfer of Care (Signed)
Immediate Anesthesia Transfer of Care Note  Patient: Seth [name]  Procedure(s) Performed: Procedure(s): OPEN REDUCTION INTERNAL FIXATION (ORIF) RIGHT ANKLE FRACTURE (Right)  Patient Location: PACU  Anesthesia Type:GA combined with regional for post-op pain  Level of Consciousness: awake and patient cooperative  Airway & Oxygen Therapy: Patient Spontanous Breathing and Patient connected to face mask oxygen  Post-op Assessment: Report given to RN and Post -op Vital signs reviewed and stable  Post vital signs: Reviewed and stable  Last Vitals:  Filed Vitals:   12/16/15 1040 12/16/15 1045  BP:    Pulse: 58 68  Temp:    Resp: 12 14    Last Pain:  Filed Vitals:   12/16/15 1049  PainSc: 5       Patients Stated Pain Goal: 0 ([phone])  Complications: No apparent anesthesia complications

## 2015-12-16 NOTE — H&P (Signed)
    PREOPERATIVE H&P  Chief Complaint: right ankle fracture  HPI: Seth [name] is a 35 y.o. male who presents for surgical treatment of right ankle fracture.  He denies any changes in medical history.  Past Medical History  Diagnosis Date  . Ankle fracture, right 12/05/2015   Past Surgical History  Procedure Laterality Date  . Orif clavicle fracture Right    Social History   Social History  . Marital Status: Married    Spouse Name: N/A  . Number of Children: N/A  . Years of Education: N/A   Social History Main Topics  . Smoking status: Never Smoker   . Smokeless tobacco: Never Used  . Alcohol Use: Yes     Comment: occasionally  . Drug Use: No  . Sexual Activity: Not Asked   Other Topics Concern  . None   Social History Narrative   History reviewed. No pertinent family history. Allergies  Allergen Reactions  . Tramadol Itching   Prior to Admission medications   Medication Sig Start Date End Date Taking? Authorizing Provider  acetaminophen (TYLENOL) 325 MG tablet Take 650 mg by mouth every 6 (six) hours as needed.   Yes Historical Provider, MD  ibuprofen (ADVIL,MOTRIN) 200 MG tablet Take 200 mg by mouth every 6 (six) hours as needed.   Yes Historical Provider, MD  oxyCODONE-acetaminophen (PERCOCET/ROXICET) 5-325 MG tablet Take 1 tablet by mouth every 6 (six) hours as needed for moderate pain (May repeat x1 in 30 minutes prn for continued moderate pain.). 12/05/15  Yes Junius Creamer, NP     Positive ROS: All other systems have been reviewed and were otherwise negative with the exception of those mentioned in the HPI and as above.  Physical Exam: General: Alert, no acute distress Cardiovascular: No pedal edema Respiratory: No cyanosis, no use of accessory musculature GI: abdomen soft Skin: No lesions in the area of chief complaint Neurologic: Sensation intact distally Psychiatric: Patient is competent for consent with normal mood and affect Lymphatic: no  lymphedema  MUSCULOSKELETAL: exam stable  Assessment: right ankle fracture  Plan: Plan for Procedure(s): OPEN REDUCTION INTERNAL FIXATION (ORIF) RIGHT ANKLE FRACTURE  The risks benefits and alternatives were discussed with the patient including but not limited to the risks of nonoperative treatment, versus surgical intervention including infection, bleeding, nerve injury,  blood clots, cardiopulmonary complications, morbidity, mortality, among others, and they were willing to proceed.   Marianna Payment, MD   12/16/2015 6:38 AM

## 2015-12-17 ENCOUNTER — Encounter (HOSPITAL_BASED_OUTPATIENT_CLINIC_OR_DEPARTMENT_OTHER): Payer: Self-pay | Admitting: Orthopaedic Surgery

## 2016-04-12 ENCOUNTER — Ambulatory Visit (INDEPENDENT_AMBULATORY_CARE_PROVIDER_SITE_OTHER): Payer: Worker's Compensation | Admitting: Orthopaedic Surgery

## 2016-04-12 DIAGNOSIS — S8261XD Displaced fracture of lateral malleolus of right fibula, subsequent encounter for closed fracture with routine healing: Secondary | ICD-10-CM

## 2016-05-17 ENCOUNTER — Telehealth (INDEPENDENT_AMBULATORY_CARE_PROVIDER_SITE_OTHER): Payer: Self-pay | Admitting: Orthopaedic Surgery

## 2016-05-17 NOTE — Telephone Encounter (Signed)
Faxed to [phone]

## 2016-05-17 NOTE — Telephone Encounter (Signed)
Seth [name] from [facility] called needing the 04/12/16 office note faxed to her.      The fax# is [phone]   The ph# is [phone]

## 2016-05-18 ENCOUNTER — Other Ambulatory Visit (INDEPENDENT_AMBULATORY_CARE_PROVIDER_SITE_OTHER): Payer: Self-pay | Admitting: Orthopaedic Surgery

## 2016-05-25 ENCOUNTER — Encounter: Payer: Self-pay | Admitting: Sports Medicine

## 2016-05-25 ENCOUNTER — Other Ambulatory Visit: Payer: Self-pay

## 2016-05-25 ENCOUNTER — Ambulatory Visit (INDEPENDENT_AMBULATORY_CARE_PROVIDER_SITE_OTHER): Payer: 59 | Admitting: Sports Medicine

## 2016-05-25 DIAGNOSIS — M79674 Pain in right toe(s): Secondary | ICD-10-CM | POA: Diagnosis not present

## 2016-05-25 DIAGNOSIS — L600 Ingrowing nail: Secondary | ICD-10-CM

## 2016-05-25 DIAGNOSIS — B070 Plantar wart: Secondary | ICD-10-CM

## 2016-05-25 MED ORDER — AMOXICILLIN-POT CLAVULANATE 875-125 MG PO TABS: 1.0000 | ORAL_TABLET | Freq: Two times a day (BID) | ORAL | 0 refills | Status: DC

## 2016-05-25 NOTE — Progress Notes (Signed)
Subjective: Seth [name] is a 35 y.o. male patient presents to office today complaining of a painful incurvated, red, hot, swollen medial greater than lateral nail border of the first toe on the right foot. This has been present for weeks Patient has treated this by trimming admits to seeing pus. Also states that he has a bumpy lesion to the top of the right foot. Patient denies fever/chills/nausea/vomitting/any other related constitutional symptoms at this time.  There are no active problems to display for this patient.   Current Outpatient Prescriptions on File Prior to Visit  Medication Sig Dispense Refill  . acetaminophen (TYLENOL) 325 MG tablet Take 650 mg by mouth every 6 (six) hours as needed.    Marland Kitchen aspirin EC 325 MG tablet Take 1 tablet (325 mg total) by mouth 2 (two) times daily. 84 tablet 0  . ibuprofen (ADVIL,MOTRIN) 200 MG tablet Take 200 mg by mouth every 6 (six) hours as needed.    . methocarbamol (ROBAXIN) 750 MG tablet Take 1 tablet (750 mg total) by mouth 2 (two) times daily as needed for muscle spasms. 60 tablet 0  . oxyCODONE-acetaminophen (PERCOCET) 5-325 MG tablet Take 1-2 tablets by mouth every 4 (four) hours as needed for severe pain. 40 tablet 0  . oxyCODONE-acetaminophen (PERCOCET/ROXICET) 5-325 MG tablet Take 1 tablet by mouth every 6 (six) hours as needed for moderate pain (May repeat x1 in 30 minutes prn for continued moderate pain.). 20 tablet 0  . senna-docusate (SENOKOT S) 8.6-50 MG tablet Take 1 tablet by mouth at bedtime as needed. 30 tablet 1   No current facility-administered medications on file prior to visit.     Allergies  Allergen Reactions  . Tramadol Itching    Objective:  There were no vitals filed for this visit.  General: Well developed, nourished, in no acute distress, alert and oriented x3   Dermatology: Raised lesion with pinpoint capillaries suggestive of wart measures less than 0.5 cm dorsal aspect of right foot, Skin is warm, dry and  supple bilateral. Right hallux nail appears to be  severely incurvated with hyperkeratosis formation at the distal aspects of  the medial and lateral nail borders. (+) Erythema. (+) Edema. (+/-) serosanguous  drainage present. The remaining nails appear unremarkable at this time. There are no open sores, lesions or other signs of infection  present.  Vascular: Dorsalis Pedis artery and Posterior Tibial artery pedal pulses are 2/4 bilateral with immedate capillary fill time. Pedal hair growth present. No lower extremity edema.   Neruologic: Grossly intact via light touch bilateral.  Musculoskeletal: Tenderness to palpation of the wart and right hallux nail fold(s). Muscular strength within normal limits in all groups bilateral.   Assesement and Plan: Problem List Items Addressed This Visit    None    Visit Diagnoses    Ingrown nail    -  Primary   Right hallux medial>lateral   Relevant Medications   amoxicillin-clavulanate (AUGMENTIN) 875-125 MG tablet   Toe pain, right       Relevant Medications   amoxicillin-clavulanate (AUGMENTIN) 875-125 MG tablet   Plantar wart       Right dorsal foot      -Discussed treatment alternatives and plan of care; Explained permanent/temporary nail avulsion and post procedure course to patient.Patient opt for PNA right hallux - After a verbal consent, injected 3 ml of a 50:50 mixture of 2% plain  lidocaine and 0.5% plain marcaine in a normal hallux block fashion. Next, a  betadine prep  was performed. Anesthesia was tested and found to be appropriate.  The offending right hallux medial and lateral nail border was then incised from the hyponychium to the epinychium. The offending nail border was removed and cleared from the field. The area was curretted for any remaining nail or spicules. Phenol application performed and the area was then flushed with alcohol and dressed with antibiotic cream and a dry sterile dressing. -Patient was instructed to leave  the dressing intact for today and begin soaking  in a weak solution of betadine and water tomorrow. Patient was instructed to  soak for 15 minutes each day and apply neosporin and a gauze or bandaid dressing each day. -Patient was instructed to monitor the toe for signs of infection and return to office if toe becomes red, hot or swollen. -Advised ice, elevation, and tylenol or motrin if needed for pain.  -Prescribed Augmentin to take as instructed for ingrown nail with infection -Applied Cantharone to right dorsal foot wart. Advised patient on blistering reaction. Once happens apply Neosporin and Band-Aid -Patient is to return in 2 weeks for follow up care/nail check or sooner if problems arise.  Landis Martins, DPM

## 2016-05-25 NOTE — Patient Instructions (Signed)

## 2016-05-30 ENCOUNTER — Telehealth (INDEPENDENT_AMBULATORY_CARE_PROVIDER_SITE_OTHER): Payer: Self-pay | Admitting: Orthopaedic Surgery

## 2016-05-30 NOTE — Telephone Encounter (Signed)
Seth [name] called saying the company responsible for his Disability information ([employer]) informed him they never received documentation from [place] stating he has a 15% rating of disability. He's wondering if this information can be sent so it can be forwarded to the appropriate office dealing with his Worker's Comp. Please give him a phone call regarding this if needed.  Please call [name] (per [name]) at: [phone] Thank you.

## 2016-06-01 NOTE — Telephone Encounter (Signed)
Dr Erlinda Hong will fill out 25 R form    PENDING

## 2016-06-08 ENCOUNTER — Ambulatory Visit (INDEPENDENT_AMBULATORY_CARE_PROVIDER_SITE_OTHER): Payer: 59 | Admitting: Sports Medicine

## 2016-06-08 ENCOUNTER — Encounter: Payer: Self-pay | Admitting: Sports Medicine

## 2016-06-08 DIAGNOSIS — M79674 Pain in right toe(s): Secondary | ICD-10-CM

## 2016-06-08 DIAGNOSIS — Z9889 Other specified postprocedural states: Secondary | ICD-10-CM

## 2016-06-08 DIAGNOSIS — B070 Plantar wart: Secondary | ICD-10-CM

## 2016-06-08 MED ORDER — FLUOROURACIL 5 % EX CREA: TOPICAL_CREAM | Freq: Two times a day (BID) | CUTANEOUS | 0 refills | Status: DC

## 2016-06-08 NOTE — Progress Notes (Signed)
Subjective: Seth [name] is a 35 y.o. male patient seen for follow up evaluation after right hallux permanent nail avulsion performed on 05/25/2016 at the medial and lateral nail borders. Patient states that he has been soaking with Betadine with no issues. States that the toe feels much better. States that he still has a few days of his antibiotic to finish because he for missed a few days by mouth. States also that the wart area is starting to shrink can get smaller. States that he did have a blister reaction of which he applied antibiotic cream to and covered with Band-Aid as instructed. Patient denies fever/chills/nausea/vomitting/any other related constitutional symptoms at this time.  There are no active problems to display for this patient.   Current Outpatient Prescriptions on File Prior to Visit  Medication Sig Dispense Refill  . methocarbamol (ROBAXIN) 500 MG tablet TK 1 T PO 2 TO 3 XD PRF SPASMS  1   No current facility-administered medications on file prior to visit.     Allergies  Allergen Reactions  . Tramadol Itching    Objective:  There were no vitals filed for this visit.  General: Well developed, nourished, in no acute distress, alert and oriented x3   Dermatology: Raised lesion with pinpoint capillaries suggestive of wart measures less than 0.5 cm dorsal aspect of right foot, Skin is warm, dry and supple bilateral. Right hallux nailthe me bed at dial and lateral nail borders with dry granular tissue with no surrounding acute signs of infection. There are no open sores, lesions or other signs of infection  present.  Vascular: Dorsalis Pedis artery and Posterior Tibial artery pedal pulses are 2/4 bilateral with immedate capillary fill time. Pedal hair growth present. No lower extremity edema.   Neruologic: Grossly intact via light touch bilateral.  Musculoskeletal: Tenderness to palpation of the wart and  decreased tenderness to right hallux nail fold(s). Muscular  strength within normal limits in all groups bilateral.   Assesement and Plan: Problem List Items Addressed This Visit    None    Visit Diagnoses    S/P nail surgery    -  Primary   Toe pain, right       Plantar wart       Relevant Medications   fluorouracil (EFUDEX) 5 % cream     -Discussed Continued care after nail procedure. Advised patient to switch over to using Epsom salt to soak until there is no more drainage, redness or irritation coming from the right great toe. Advised patient to leave Band-Aid off and toe open to air, especially at night or in the day. Advised patient to continue with Augmentin until completed  -Applied Cantharone application #2  to right dorsal foot wart. Advised patient on blistering reaction. Once happens apply Neosporin and Band-Aid. Prescribed Efudex cream to apply twice daily to warts site covered with Band-Aid as instructed.  -Patient is to return in 4 weeks for follow up care for wart / final nail check or sooner if problems arise.  Landis Martins, DPM

## 2016-06-16 NOTE — Telephone Encounter (Signed)
Called Ms. Seth [name] so I can get fax number no answer. Left message on machine to call us back with fax number so we can go ahead and fax form to them.  If she calls back please send me a message with fax number . Thank you.

## 2016-07-06 ENCOUNTER — Ambulatory Visit: Payer: 59 | Admitting: Sports Medicine

## 2016-07-09 ENCOUNTER — Other Ambulatory Visit (INDEPENDENT_AMBULATORY_CARE_PROVIDER_SITE_OTHER): Payer: Self-pay | Admitting: Orthopaedic Surgery

## 2016-07-13 ENCOUNTER — Ambulatory Visit (INDEPENDENT_AMBULATORY_CARE_PROVIDER_SITE_OTHER): Payer: 59 | Admitting: Sports Medicine

## 2016-07-13 ENCOUNTER — Encounter: Payer: Self-pay | Admitting: Sports Medicine

## 2016-07-13 DIAGNOSIS — B070 Plantar wart: Secondary | ICD-10-CM

## 2016-07-13 DIAGNOSIS — B359 Dermatophytosis, unspecified: Secondary | ICD-10-CM

## 2016-07-13 DIAGNOSIS — Z01818 Encounter for other preprocedural examination: Secondary | ICD-10-CM

## 2016-07-13 DIAGNOSIS — Z9889 Other specified postprocedural states: Secondary | ICD-10-CM

## 2016-07-13 MED ORDER — TERBINAFINE HCL 1 % EX SOLN: CUTANEOUS | 3 refills | Status: DC

## 2016-07-13 NOTE — Progress Notes (Signed)
Subjective: Seth [name] is a 36 y.o. male patient seen for follow up evaluation after right hallux permanent nail avulsion performed on 05/25/2016 at the medial and lateral nail borders. Patient states that area feels good and he wants to discuss more treatment options for wart at right foot and also the itchy sensation he gets to the tops of both feet after wearing shoes. No other issues noted.  There are no active problems to display for this patient.   Current Outpatient Prescriptions on File Prior to Visit  Medication Sig Dispense Refill  . fluorouracil (EFUDEX) 5 % cream Apply topically 2 (two) times daily. 40 g 0  . methocarbamol (ROBAXIN) 500 MG tablet TK 1 T PO 2 TO 3 XD PRF SPASMS  1   No current facility-administered medications on file prior to visit.     Allergies  Allergen Reactions  . Tramadol Itching    Objective:  There were no vitals filed for this visit.  General: Well developed, nourished, in no acute distress, alert and oriented x3   Dermatology: Raised lesion with pinpoint capillaries suggestive of wart measures less than 0.5 cm dorsal aspect of right foot, Skin is warm, dry and supple bilateral. Right hallux nail borders healed. There are no open sores, lesions or other signs of infection present. Possible annular rash both feet suggestive of very early tinea.   Vascular: Dorsalis Pedis artery and Posterior Tibial artery pedal pulses are 2/4 bilateral with immedate capillary fill time. Pedal hair growth present. No lower extremity edema.   Neruologic: Grossly intact via light touch bilateral.  Musculoskeletal: Tenderness to palpation of the wart and  no tenderness to right hallux nail fold(s). Muscular strength within normal limits in all groups bilateral.   Assesement and Plan: Problem List Items Addressed This Visit    None    Visit Diagnoses    Pre-op exam    -  Primary   S/P nail surgery       Plantar wart       Relevant Medications   Terbinafine  HCl (LAMISIL AT SPRAY) 1 % SOLN   Tinea       Relevant Medications   Terbinafine HCl (LAMISIL AT SPRAY) 1 % SOLN     -Discussed Continued care after nail procedure. Area healed and no more treatment is needed at this time -Rx lamisil spray to use daily and recommend to change socks -Patient opt for surgical management for right foot wart. Consent obtained for Excision of wart with possible laser right foot. Pre and Post op course explained. Risks, benefits, alternatives explained. No guarantees given or implied. Surgical booking slip submitted and provided patient with Surgical packet and info for [facility]. -Dispensed surgical shoe to use post op  -Recommend light duty or 2 weeks off work while skin heals -Patient is to return after surgery or sooner if problems arise.  Landis Martins, DPM

## 2016-07-13 NOTE — Patient Instructions (Signed)
Pre-Operative Instructions  Congratulations, you have decided to take an important step to improving your quality of life.  You can be assured that the doctors of [facility] will be with you every step of the way.  1. Plan to be at the surgery center/hospital at least 1 (one) hour prior to your scheduled time unless otherwise directed by the surgical center/hospital staff.  You must have a responsible adult accompany you, remain during the surgery and drive you home.  Make sure you have directions to the surgical center/hospital and know how to get there on time. 2. For hospital based surgery you will need to obtain a history and physical form from your family physician within 1 month prior to the date of surgery- we will give you a form for you primary physician.  3. We make every effort to accommodate the date you request for surgery.  There are however, times where surgery dates or times have to be moved.  We will contact you as soon as possible if a change in schedule is required.   4. No Aspirin/Ibuprofen for one week before surgery.  If you are on aspirin, any non-steroidal anti-inflammatory medications (Mobic, Aleve, Ibuprofen) you should stop taking it 7 days prior to your surgery.  You make take Tylenol  For pain prior to surgery.  5. Medications- If you are taking daily heart and blood pressure medications, seizure, reflux, allergy, asthma, anxiety, pain or diabetes medications, make sure the surgery center/hospital is aware before the day of surgery so they may notify you which medications to take or avoid the day of surgery. 6. No food or drink after midnight the night before surgery unless directed otherwise by surgical center/hospital staff. 7. No alcoholic beverages 24 hours prior to surgery.  No smoking 24 hours prior to or 24 hours after surgery. 8. Wear loose pants or shorts- loose enough to fit over bandages, boots, and casts. 9. No slip on shoes, sneakers are best. 10. Bring  your boot with you to the surgery center/hospital.  Also bring crutches or a walker if your physician has prescribed it for you.  If you do not have this equipment, it will be provided for you after surgery. 11. If you have not been contracted by the surgery center/hospital by the day before your surgery, call to confirm the date and time of your surgery. 12. Leave-time from work may vary depending on the type of surgery you have.  Appropriate arrangements should be made prior to surgery with your employer. 13. Prescriptions will be provided immediately following surgery by your doctor.  Have these filled as soon as possible after surgery and take the medication as directed. 14. Remove nail polish on the operative foot. 15. Wash the night before surgery.  The night before surgery wash the foot and leg well with the antibacterial soap provided and water paying special attention to beneath the toenails and in between the toes.  Rinse thoroughly with water and dry well with a towel.  Perform this wash unless told not to do so by your physician.  Enclosed: 1 Ice pack (please put in freezer the night before surgery)   1 Hibiclens skin cleaner   Pre-op Instructions  If you have any questions regarding the instructions, do not hesitate to call our office.  [place]: [address] [place], [place] [address] [phone]  [place]: [address] [place], [place] [address] [phone]  [place]: [address]  [place], [place] [address] [phone]   Dr.   Norman Regal DPM, Dr. Matthew Wagoner DPM, Dr. M. Todd Hyatt DPM, Dr. Zadaya Cuadra DPM 

## 2016-07-14 ENCOUNTER — Telehealth: Payer: Self-pay | Admitting: *Deleted

## 2016-07-14 NOTE — Telephone Encounter (Signed)
"  I am calling to schedule my surgery with Dr. Cannon Kettle.  She told me to give you a call."  She can do it on January 22 or 29.  "Let's do it on the 22nd."  I will get it scheduled.  The will give you a call with an arrival time the Friday before surgery date.  You can go ahead and register with the surgical center.  "Okay, I will.  Thank you."

## 2016-07-22 ENCOUNTER — Telehealth: Payer: Self-pay | Admitting: Sports Medicine

## 2016-07-25 ENCOUNTER — Encounter: Payer: Self-pay | Admitting: Sports Medicine

## 2016-07-25 ENCOUNTER — Other Ambulatory Visit: Payer: Self-pay | Admitting: Sports Medicine

## 2016-07-25 DIAGNOSIS — B078 Other viral warts: Secondary | ICD-10-CM | POA: Diagnosis not present

## 2016-07-25 DIAGNOSIS — D492 Neoplasm of unspecified behavior of bone, soft tissue, and skin: Secondary | ICD-10-CM | POA: Diagnosis not present

## 2016-07-25 DIAGNOSIS — B070 Plantar wart: Secondary | ICD-10-CM | POA: Diagnosis not present

## 2016-07-25 MED ORDER — DOCUSATE SODIUM 100 MG PO CAPS: 100.0000 mg | ORAL_CAPSULE | Freq: Two times a day (BID) | ORAL | 0 refills | Status: DC

## 2016-07-25 NOTE — Progress Notes (Signed)
Sent to pharmacy [facility] to have if needed post op -Dr. Cannon Kettle

## 2016-07-26 ENCOUNTER — Telehealth: Payer: Self-pay | Admitting: Sports Medicine

## 2016-07-26 NOTE — Telephone Encounter (Signed)
Post op check phone call made to patient. Patient did not answer. Left voicemail with instructions for patient to call office if there are any problems or issues. Patient to follow up as scheduled in 1 week for continued post op care. -Dr. Cannon Kettle

## 2016-08-01 ENCOUNTER — Encounter: Payer: Self-pay | Admitting: Sports Medicine

## 2016-08-01 NOTE — Progress Notes (Signed)
DOS 01.22.2018 Excision (cut out wart) right foot.

## 2016-08-03 ENCOUNTER — Ambulatory Visit (INDEPENDENT_AMBULATORY_CARE_PROVIDER_SITE_OTHER): Payer: Self-pay | Admitting: Sports Medicine

## 2016-08-03 ENCOUNTER — Encounter: Payer: Self-pay | Admitting: Sports Medicine

## 2016-08-03 DIAGNOSIS — M79671 Pain in right foot: Secondary | ICD-10-CM

## 2016-08-03 DIAGNOSIS — Z9889 Other specified postprocedural states: Secondary | ICD-10-CM

## 2016-08-03 DIAGNOSIS — B070 Plantar wart: Secondary | ICD-10-CM

## 2016-08-04 NOTE — Progress Notes (Signed)
Subjective: Seth [name] is a 35 y.o. male patient seen today in office for POV #1 (DOS 07-25-16), S/P Excision of lesion/wart right foot. Patient denies pain at surgical site, denies calf pain, denies headache, chest pain, shortness of breath, nausea, vomiting, fever, or chills. Patient states that he is doing well and is only taking Ibuprofen and is back at work on desk duty. No other issues noted.   There are no active problems to display for this patient.   Current Outpatient Prescriptions on File Prior to Visit  Medication Sig Dispense Refill  . docusate sodium (COLACE) 100 MG capsule Take 1 capsule (100 mg total) by mouth 2 (two) times daily. 10 capsule 0  . fluorouracil (EFUDEX) 5 % cream Apply topically 2 (two) times daily. 40 g 0  . HYDROcodone-acetaminophen (NORCO/VICODIN) 5-325 MG tablet Take 1-2 tablets by mouth every 6 (six) hours as needed for moderate pain.    Marland Kitchen ibuprofen (ADVIL,MOTRIN) 800 MG tablet Take 800 mg by mouth every 8 (eight) hours as needed.    . methocarbamol (ROBAXIN) 500 MG tablet TK 1 T PO 2 TO 3 XD PRF SPASMS  1  . promethazine (PHENERGAN) 25 MG tablet Take 25 mg by mouth every 8 (eight) hours as needed for nausea or vomiting.    . Terbinafine HCl (LAMISIL AT SPRAY) 1 % SOLN Spray daily to feet at bedtime 125 mL 3   No current facility-administered medications on file prior to visit.     Allergies  Allergen Reactions  . Tramadol Itching    Objective: There were no vitals filed for this visit.  General: No acute distress, AAOx3  Right foot: Sutures intact with no gapping or dehiscence at surgical site, mild swelling to forefoot on right, no erythema, no warmth, no drainage, no signs of infection noted, Capillary fill time <3 seconds in all digits, gross sensation present via light touch to right foot. No pain or crepitation with range of motion right foot.  No pain with calf compression.   Assessment and Plan:  Problem List Items Addressed This Visit     None    Visit Diagnoses    S/P foot surgery, right    -  Primary   Plantar wart       Right foot pain           -Patient seen and evaluated -Applied antibiotic cream and dry sterile dressing to surgical site right foot secured with ACE wrap and stockinet  -Advised patient to make sure to keep dressings clean, dry, and intact to left/right surgical site, removing the ACE as needed  -Advised patient to continue with post-op shoe on right foot   -Advised patient to limit activity to necessity  -Work note continue desk/light duty  -Advised patient to ice and elevate as necessary  -Will plan for suture removal at next office visit. In the meantime, patient to call office if any issues or problems arise.   Landis Martins, DPM

## 2016-08-10 ENCOUNTER — Encounter: Payer: 59 | Admitting: Sports Medicine

## 2016-09-07 ENCOUNTER — Telehealth: Payer: Self-pay | Admitting: *Deleted

## 2016-09-07 NOTE — Telephone Encounter (Addendum)
-----   Message from Landis Martins, [place] sent at 09/07/2016  8:13 AM EST ----- Regarding: Check on patient  I haven't seen patient since POV #1. Please check on patient. Sutures were nonabsorable and are overdue to be removed Thanks Dr. Cannon Kettle. Left message for pt to make an appt to see Dr. Cannon Kettle and to have sutures removed. Pt states everything looks fine, and his wife the nurse took his stitches out, will make an appt if Dr. Cannon Kettle really needs him to. I spoke with pt and thanked him for letting us know he was okay and to call if he needed anything.

## 2016-09-07 NOTE — Telephone Encounter (Signed)
Ok thank you I just wanted to make sure things were ok.  He should come in if there are any problems -Dr. Cannon Kettle

## 2016-10-18 DIAGNOSIS — M7712 Lateral epicondylitis, left elbow: Secondary | ICD-10-CM | POA: Diagnosis not present

## 2016-10-18 DIAGNOSIS — M25522 Pain in left elbow: Secondary | ICD-10-CM | POA: Diagnosis not present

## 2016-10-18 DIAGNOSIS — M25532 Pain in left wrist: Secondary | ICD-10-CM | POA: Diagnosis not present

## 2016-10-19 DIAGNOSIS — M7712 Lateral epicondylitis, left elbow: Secondary | ICD-10-CM | POA: Diagnosis not present

## 2016-10-19 DIAGNOSIS — M25639 Stiffness of unspecified wrist, not elsewhere classified: Secondary | ICD-10-CM | POA: Diagnosis not present

## 2016-10-19 DIAGNOSIS — R29898 Other symptoms and signs involving the musculoskeletal system: Secondary | ICD-10-CM | POA: Diagnosis not present

## 2016-10-19 DIAGNOSIS — M79632 Pain in left forearm: Secondary | ICD-10-CM | POA: Diagnosis not present

## 2016-10-28 DIAGNOSIS — M7712 Lateral epicondylitis, left elbow: Secondary | ICD-10-CM | POA: Diagnosis not present

## 2016-10-28 DIAGNOSIS — R29898 Other symptoms and signs involving the musculoskeletal system: Secondary | ICD-10-CM | POA: Diagnosis not present

## 2016-10-28 DIAGNOSIS — M79632 Pain in left forearm: Secondary | ICD-10-CM | POA: Diagnosis not present

## 2016-10-28 DIAGNOSIS — M25639 Stiffness of unspecified wrist, not elsewhere classified: Secondary | ICD-10-CM | POA: Diagnosis not present

## 2016-11-10 ENCOUNTER — Other Ambulatory Visit: Payer: Self-pay | Admitting: Sports Medicine

## 2016-11-17 DIAGNOSIS — M25522 Pain in left elbow: Secondary | ICD-10-CM | POA: Diagnosis not present

## 2016-11-17 DIAGNOSIS — M7712 Lateral epicondylitis, left elbow: Secondary | ICD-10-CM | POA: Diagnosis not present

## 2016-11-17 DIAGNOSIS — M25532 Pain in left wrist: Secondary | ICD-10-CM | POA: Diagnosis not present

## 2017-03-02 DIAGNOSIS — M542 Cervicalgia: Secondary | ICD-10-CM | POA: Diagnosis not present

## 2017-04-28 DIAGNOSIS — E782 Mixed hyperlipidemia: Secondary | ICD-10-CM | POA: Diagnosis not present

## 2017-04-28 DIAGNOSIS — Z23 Encounter for immunization: Secondary | ICD-10-CM | POA: Diagnosis not present

## 2017-05-30 DIAGNOSIS — E782 Mixed hyperlipidemia: Secondary | ICD-10-CM | POA: Diagnosis not present

## 2017-06-21 ENCOUNTER — Ambulatory Visit: Payer: Self-pay | Admitting: Licensed Clinical Social Worker

## 2017-07-10 ENCOUNTER — Ambulatory Visit: Payer: 59 | Admitting: Licensed Clinical Social Worker

## 2017-07-10 DIAGNOSIS — F321 Major depressive disorder, single episode, moderate: Secondary | ICD-10-CM | POA: Diagnosis not present

## 2017-07-10 DIAGNOSIS — F411 Generalized anxiety disorder: Secondary | ICD-10-CM
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
## 2017-07-25 ENCOUNTER — Encounter: Payer: Self-pay | Admitting: Psychiatry

## 2017-07-25 ENCOUNTER — Ambulatory Visit: Payer: 59 | Admitting: Psychiatry

## 2017-07-25 ENCOUNTER — Other Ambulatory Visit: Payer: Self-pay

## 2017-07-25 VITALS — BP 185/110 | HR 105 | Temp 97.7°F | Wt 214.4 lb

## 2017-07-25 DIAGNOSIS — F4310 Post-traumatic stress disorder, unspecified: Secondary | ICD-10-CM | POA: Diagnosis not present

## 2017-07-25 DIAGNOSIS — F330 Major depressive disorder, recurrent, mild: Secondary | ICD-10-CM | POA: Diagnosis not present

## 2017-07-25 MED ORDER — HYDROXYZINE PAMOATE 25 MG PO CAPS: 25.0000 mg | ORAL_CAPSULE | Freq: Two times a day (BID) | ORAL | 1 refills | Status: DC | PRN

## 2017-07-25 MED ORDER — PRAZOSIN HCL 1 MG PO CAPS: 1.0000 mg | ORAL_CAPSULE | Freq: Every day | ORAL | 1 refills | Status: DC

## 2017-07-25 MED ORDER — TRAZODONE HCL 50 MG PO TABS: 50.0000 mg | ORAL_TABLET | Freq: Every evening | ORAL | 1 refills | Status: DC | PRN

## 2017-07-25 NOTE — Patient Instructions (Signed)
Hydroxyzine capsules or tablets What is this medicine? HYDROXYZINE (hye [place] i zeen) is an antihistamine. This medicine is used to treat allergy symptoms. It is also used to treat anxiety and tension. This medicine can be used with other medicines to induce sleep before surgery. This medicine may be used for other purposes; ask your health care provider or pharmacist if you have questions. COMMON BRAND NAME(S): ANX, Atarax, Rezine, Vistaril What should I tell my health care provider before I take this medicine? They need to know if you have any of these conditions: -any chronic illness -difficulty passing urine -glaucoma -heart disease -kidney disease -liver disease -lung disease -an unusual or allergic reaction to hydroxyzine, cetirizine, other medicines, foods, dyes, or preservatives -pregnant or trying to get pregnant -breast-feeding How should I use this medicine? Take this medicine by mouth with a full glass of water. Follow the directions on the prescription label. You may take this medicine with food or on an empty stomach. Take your medicine at regular intervals. Do not take your medicine more often than directed. Talk to your pediatrician regarding the use of this medicine in children. Special care may be needed. While this drug may be prescribed for children as young as 75 years of age for selected conditions, precautions do apply. Patients over 62 years old may have a stronger reaction and need a smaller dose. Overdosage: If you think you have taken too much of this medicine contact a poison control center or emergency room at once. NOTE: This medicine is only for you. Do not share this medicine with others. What if I miss a dose? If you miss a dose, take it as soon as you can. If it is almost time for your next dose, take only that dose. Do not take double or extra doses. What may interact with this medicine? -alcohol -barbiturate medicines for sleep or seizures -medicines for  colds, allergies -medicines for depression, anxiety, or emotional disturbances -medicines for pain -medicines for sleep -muscle relaxants This list may not describe all possible interactions. Give your health care provider a list of all the medicines, herbs, non-prescription drugs, or dietary supplements you use. Also tell them if you smoke, drink alcohol, or use illegal drugs. Some items may interact with your medicine. What should I watch for while using this medicine? Tell your doctor or health care professional if your symptoms do not improve. You may get drowsy or dizzy. Do not drive, use machinery, or do anything that needs mental alertness until you know how this medicine affects you. Do not stand or sit up quickly, especially if you are an older patient. This reduces the risk of dizzy or fainting spells. Alcohol may interfere with the effect of this medicine. Avoid alcoholic drinks. Your mouth may get dry. Chewing sugarless gum or sucking hard candy, and drinking plenty of water may help. Contact your doctor if the problem does not go away or is severe. This medicine may cause dry eyes and blurred vision. If you wear contact lenses you may feel some discomfort. Lubricating drops may help. See your eye doctor if the problem does not go away or is severe. If you are receiving skin tests for allergies, tell your doctor you are using this medicine. What side effects may I notice from receiving this medicine? Side effects that you should report to your doctor or health care professional as soon as possible: -fast or irregular heartbeat -difficulty passing urine -seizures -slurred speech or confusion -tremor Side effects that  usually do not require medical attention (report to your doctor or health care professional if they continue or are bothersome): -constipation -drowsiness -fatigue -headache -stomach upset This list may not describe all possible side effects. Call your doctor for  medical advice about side effects. You may report side effects to FDA at 1-800-FDA-1088. Where should I keep my medicine? Keep out of the reach of children. Store at room temperature between 15 and 30 degrees C (59 and 86 degrees F). Keep container tightly closed. Throw away any unused medicine after the expiration date. NOTE: This sheet is a summary. It may not cover all possible information. If you have questions about this medicine, talk to your doctor, pharmacist, or health care provider.  2018 [employer]/Gold Standard (2007-11-02 14:50:59) Trazodone tablets What is this medicine? TRAZODONE (TRAZ oh done) is used to treat depression. This medicine may be used for other purposes; ask your health care provider or pharmacist if you have questions. COMMON BRAND NAME(S): Desyrel What should I tell my health care provider before I take this medicine? They need to know if you have any of these conditions: -attempted suicide or thinking about it -bipolar disorder -bleeding problems -glaucoma -heart disease, or previous heart attack -irregular heart beat -kidney or liver disease -low levels of sodium in the blood -an unusual or allergic reaction to trazodone, other medicines, foods, dyes or preservatives -pregnant or trying to get pregnant -breast-feeding How should I use this medicine? Take this medicine by mouth with a glass of water. Follow the directions on the prescription label. Take this medicine shortly after a meal or a light snack. Take your medicine at regular intervals. Do not take your medicine more often than directed. Do not stop taking this medicine suddenly except upon the advice of your doctor. Stopping this medicine too quickly may cause serious side effects or your condition may worsen. A special MedGuide will be given to you by the pharmacist with each prescription and refill. Be sure to read this information carefully each time. Talk to your pediatrician regarding the use  of this medicine in children. Special care may be needed. Overdosage: If you think you have taken too much of this medicine contact a poison control center or emergency room at once. NOTE: This medicine is only for you. Do not share this medicine with others. What if I miss a dose? If you miss a dose, take it as soon as you can. If it is almost time for your next dose, take only that dose. Do not take double or extra doses. What may interact with this medicine? Do not take this medicine with any of the following medications: -certain medicines for fungal infections like fluconazole, itraconazole, ketoconazole, posaconazole, voriconazole -cisapride -dofetilide -dronedarone -linezolid -MAOIs like Carbex, Eldepryl, Marplan, Nardil, and Parnate -mesoridazine -methylene blue (injected into a vein) -pimozide -saquinavir -thioridazine -ziprasidone This medicine may also interact with the following medications: -alcohol -antiviral medicines for HIV or AIDS -aspirin and aspirin-like medicines -barbiturates like phenobarbital -certain medicines for blood pressure, heart disease, irregular heart beat -certain medicines for depression, anxiety, or psychotic disturbances -certain medicines for migraine headache like almotriptan, eletriptan, frovatriptan, naratriptan, rizatriptan, sumatriptan, zolmitriptan -certain medicines for seizures like carbamazepine and phenytoin -certain medicines for sleep -certain medicines that treat or prevent blood clots like dalteparin, enoxaparin, warfarin -digoxin -fentanyl -lithium -NSAIDS, medicines for pain and inflammation, like ibuprofen or naproxen -other medicines that prolong the QT interval (cause an abnormal heart rhythm) -rasagiline -supplements like St. John's wort, kava kava,  valerian -tramadol -tryptophan This list may not describe all possible interactions. Give your health care provider a list of all the medicines, herbs, non-prescription  drugs, or dietary supplements you use. Also tell them if you smoke, drink alcohol, or use illegal drugs. Some items may interact with your medicine. What should I watch for while using this medicine? Tell your doctor if your symptoms do not get better or if they get worse. Visit your doctor or health care professional for regular checks on your progress. Because it may take several weeks to see the full effects of this medicine, it is important to continue your treatment as prescribed by your doctor. Patients and their families should watch out for new or worsening thoughts of suicide or depression. Also watch out for sudden changes in feelings such as feeling anxious, agitated, panicky, irritable, hostile, aggressive, impulsive, severely restless, overly excited and hyperactive, or not being able to sleep. If this happens, especially at the beginning of treatment or after a change in dose, call your health care professional. Dennis Bast may get drowsy or dizzy. Do not drive, use machinery, or do anything that needs mental alertness until you know how this medicine affects you. Do not stand or sit up quickly, especially if you are an older patient. This reduces the risk of dizzy or fainting spells. Alcohol may interfere with the effect of this medicine. Avoid alcoholic drinks. This medicine may cause dry eyes and blurred vision. If you wear contact lenses you may feel some discomfort. Lubricating drops may help. See your eye doctor if the problem does not go away or is severe. Your mouth may get dry. Chewing sugarless gum, sucking hard candy and drinking plenty of water may help. Contact your doctor if the problem does not go away or is severe. What side effects may I notice from receiving this medicine? Side effects that you should report to your doctor or health care professional as soon as possible: -allergic reactions like skin rash, itching or hives, swelling of the face, lips, or tongue -elevated mood,  decreased need for sleep, racing thoughts, impulsive behavior -confusion -fast, irregular heartbeat -feeling faint or lightheaded, falls -feeling agitated, angry, or irritable -loss of balance or coordination -painful or prolonged erections -restlessness, pacing, inability to keep still -suicidal thoughts or other mood changes -tremors -trouble sleeping -seizures -unusual bleeding or bruising Side effects that usually do not require medical attention (report to your doctor or health care professional if they continue or are bothersome): -change in sex drive or performance -change in appetite or weight -constipation -headache -muscle aches or pains -nausea This list may not describe all possible side effects. Call your doctor for medical advice about side effects. You may report side effects to FDA at 1-800-FDA-1088. Where should I keep my medicine? Keep out of the reach of children. Store at room temperature between 15 and 30 degrees C (59 to 86 degrees F). Protect from light. Keep container tightly closed. Throw away any unused medicine after the expiration date. NOTE: This sheet is a summary. It may not cover all possible information. If you have questions about this medicine, talk to your doctor, pharmacist, or health care provider.  2018 [employer]/Gold Standard (2015-11-19 16:57:05) Prazosin capsules What is this medicine? PRAZOSIN (PRA zoe sin) is an antihypertensive. It works by relaxing the blood vessels. It is used to treat high blood pressure. This medicine may be used for other purposes; ask your health care provider or pharmacist if you have questions. COMMON BRAND  NAME(S): Minipress What should I tell my health care provider before I take this medicine? They need to know if you have any of the following conditions: -kidney disease -an unusual or allergic reaction to prazosin, other medicines, foods, dyes, or preservatives -pregnant or trying to get  pregnant -breast-feeding How should I use this medicine? Take this medicine by mouth with a glass of water. Follow the directions on the prescription label. Take your doses at regular intervals. Do not take your medicine more often than directed. Do not stop taking except on the advice of your doctor or health care professional. Talk to your pediatrician regarding the use of this medicine in children. Special care may be needed. Overdosage: If you think you have taken too much of this medicine contact a poison control center or emergency room at once. NOTE: This medicine is only for you. Do not share this medicine with others. What if I miss a dose? If you miss a dose, take it as soon as you can. If it is almost time for your next dose, take only that dose. Do not take double or extra doses. What may interact with this medicine? -diuretics -medicines for high blood pressure -sildenafil -tadalafil -vardenafil This list may not describe all possible interactions. Give your health care provider a list of all the medicines, herbs, non-prescription drugs, or dietary supplements you use. Also tell them if you smoke, drink alcohol, or use illegal drugs. Some items may interact with your medicine. What should I watch for while using this medicine? Visit your doctor or health care professional for regular checks on your progress. Check your blood pressure regularly. Ask your doctor or health care professional what your blood pressure should be and when you should contact him or her. Drowsiness and dizziness are more likely to occur after the first dose, after an increase in dose, or during hot weather or exercise. These effects can decrease once your body adjusts to this medicine. Do not drive, use machinery, or do anything that needs mental alertness until you know how this drug affects you. Do not stand or sit up quickly, especially if you are an older patient. This reduces the risk of dizzy or fainting  spells. Alcohol can make you more drowsy and dizzy. Avoid alcoholic drinks. Do not treat yourself for coughs, colds or allergies without asking your doctor or health care professional for advice. Some ingredients can increase your blood pressure. Your mouth may get dry. Chewing sugarless gum or sucking hard candy, and drinking plenty of water may help. Contact your doctor if the problem does not go away or is severe. For males, contact your doctor or health care professional right away if you have an erection that lasts longer than 4 hours or if it becomes painful. This may be a sign of a serious problem and must be treated right away to prevent permanent damage. What side effects may I notice from receiving this medicine? Side effects that you should report to your doctor or health care professional as soon as possible: -blurred vision -difficulty breathing, shortness of breath -fainting spells, light headedness -fast or irregular heartbeat, palpitations or chest pain -numbness in hands or feet -prolonged painful erection of the penis -swelling of the legs or ankles -unusually weak or tired Side effects that usually do not require medical attention (report to your doctor or health care professional if they continue or are bothersome): -constipation or diarrhea -headache -nausea -sexual difficulties -stomach pain This list may not describe  all possible side effects. Call your doctor for medical advice about side effects. You may report side effects to FDA at 1-800-FDA-1088. Where should I keep my medicine? Keep out of the reach of children. Store at room temperature between 15 and 30 degrees C (59 and 86 degrees F). Protect from light. Keep container tightly closed. Throw away any unused medicine after the expiration date. NOTE: This sheet is a summary. It may not cover all possible information. If you have questions about this medicine, talk to your doctor, pharmacist, or health care  provider.  2018 [employer]/Gold Standard (2013-12-16 09:13:50)
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
## 2017-07-26 ENCOUNTER — Encounter: Payer: Self-pay | Admitting: Psychiatry

## 2017-07-31 ENCOUNTER — Ambulatory Visit: Payer: 59 | Admitting: Licensed Clinical Social Worker

## 2017-08-21 ENCOUNTER — Ambulatory Visit: Payer: 59 | Admitting: Psychiatry

## 2017-08-21 ENCOUNTER — Other Ambulatory Visit: Payer: Self-pay

## 2017-08-21 ENCOUNTER — Encounter: Payer: Self-pay | Admitting: Psychiatry

## 2017-08-21 VITALS — BP 135/87 | HR 88 | Temp 97.7°F | Wt 220.8 lb

## 2017-08-21 DIAGNOSIS — F330 Major depressive disorder, recurrent, mild: Secondary | ICD-10-CM

## 2017-08-21 DIAGNOSIS — F4310 Post-traumatic stress disorder, unspecified: Secondary | ICD-10-CM

## 2017-08-21 MED ORDER — TRAZODONE HCL 50 MG PO TABS: 50.0000 mg | ORAL_TABLET | Freq: Every evening | ORAL | 0 refills | Status: DC | PRN

## 2017-08-21 MED ORDER — PRAZOSIN HCL 1 MG PO CAPS: 1.0000 mg | ORAL_CAPSULE | Freq: Every day | ORAL | 0 refills | Status: DC

## 2017-08-21 MED ORDER — SERTRALINE HCL 50 MG PO TABS: 75.0000 mg | ORAL_TABLET | Freq: Every day | ORAL | 0 refills | Status: DC

## 2017-08-21 MED ORDER — BUPROPION HCL ER (XL) 150 MG PO TB24: 150.0000 mg | ORAL_TABLET | Freq: Every day | ORAL | 0 refills | Status: DC

## 2017-08-21 MED ORDER — HYDROXYZINE PAMOATE 25 MG PO CAPS: 25.0000 mg | ORAL_CAPSULE | Freq: Two times a day (BID) | ORAL | 0 refills | Status: DC | PRN
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
## 2017-08-28 ENCOUNTER — Ambulatory Visit: Payer: 59 | Admitting: Licensed Clinical Social Worker

## 2017-08-28 DIAGNOSIS — E782 Mixed hyperlipidemia: Secondary | ICD-10-CM | POA: Diagnosis not present

## 2017-08-28 DIAGNOSIS — Z0000 Encounter for general adult medical examination without abnormal findings: Secondary | ICD-10-CM | POA: Diagnosis not present

## 2017-08-28 DIAGNOSIS — F4310 Post-traumatic stress disorder, unspecified: Secondary | ICD-10-CM | POA: Diagnosis not present

## 2017-08-28 DIAGNOSIS — F330 Major depressive disorder, recurrent, mild: Secondary | ICD-10-CM | POA: Diagnosis not present

## 2017-08-28 DIAGNOSIS — Z79899 Other long term (current) drug therapy: Secondary | ICD-10-CM | POA: Diagnosis not present

## 2017-09-01 NOTE — Progress Notes (Signed)
  THERAPIST PROGRESS NOTE   Date of Service:   08/28/2017  Session Time:   1hour  Patient:   AYDIEN MAJETTE   DOB:   08-24-1980  MR Number:  [id number]  Location:  [facility] [place] [address] Dept: [phone]            Provider/Observer:  Lubertha South Counselor  Risk of Suicide/Violence: virtually non-existent   Diagnosis:    MDD (major depressive disorder), recurrent episode, mild (HCC)  PTSD (post-traumatic stress disorder)  Type of Therapy: Individual Therapy  Treatment Goals addressed: Anxiety  Participation Level: Active   Interventions: CBT and Motivational Interviewing   Behavioral Response: CasualAlertAnxious   Summary:  Patient was interactive throughout the session.  Patient reports that he continues to thrash while asleep.  He denies any nightmares and reports that he is not apprehensive about going to work.  He reports that he was able to create a support group of people that he is able to communicate with about his past trauma.  He reports being able to talk about his experience has assisted him with recovery.  With the assistance of the therapist he was able to create a verbal list of coping strategies to use while out in the community with his family.  He was able to add about 5 coping skills to the list.  Therapist introduced grounding with therapist and discussed when and how to use the technique    Plan: KARSIN PESTA will continue to use coping skills   Return again in 2 weeks.

## 2017-09-15 ENCOUNTER — Telehealth: Payer: Self-pay

## 2017-09-15 DIAGNOSIS — F4310 Post-traumatic stress disorder, unspecified: Secondary | ICD-10-CM

## 2017-09-15 DIAGNOSIS — F330 Major depressive disorder, recurrent, mild: Secondary | ICD-10-CM

## 2017-09-15 MED ORDER — BUPROPION HCL ER (XL) 150 MG PO TB24: 150.0000 mg | ORAL_TABLET | Freq: Every day | ORAL | 0 refills | Status: DC

## 2017-09-15 MED ORDER — TRAZODONE HCL 50 MG PO TABS: 50.0000 mg | ORAL_TABLET | Freq: Every evening | ORAL | 0 refills | Status: DC | PRN

## 2017-09-15 MED ORDER — PRAZOSIN HCL 1 MG PO CAPS: 1.0000 mg | ORAL_CAPSULE | Freq: Every day | ORAL | 0 refills | Status: DC

## 2017-09-15 MED ORDER — HYDROXYZINE PAMOATE 25 MG PO CAPS: 25.0000 mg | ORAL_CAPSULE | Freq: Two times a day (BID) | ORAL | 0 refills | Status: DC | PRN

## 2017-09-15 MED ORDER — SERTRALINE HCL 50 MG PO TABS: 75.0000 mg | ORAL_TABLET | Freq: Every day | ORAL | 0 refills | Status: DC

## 2017-09-15 NOTE — Telephone Encounter (Signed)
pt needs a 90 day supply sent [facility] and a [facility]day [facility].  [facility] not [facility] the 90 day supply you sent to them only 30 day.  insurance company states they do a 90 day but it has to be sent to [facility]  So please send a 90 day to [facility] and then a [facility] day [facility]

## 2017-09-15 NOTE — Telephone Encounter (Signed)
I HAVE SENT 90 DAYS TO [facility] AND 30 DAYS TO [facility].

## 2017-09-18 NOTE — Telephone Encounter (Signed)
Pt notified. Message left for pt

## 2017-10-03 ENCOUNTER — Ambulatory Visit: Payer: 59 | Admitting: Licensed Clinical Social Worker

## 2017-10-03 DIAGNOSIS — F330 Major depressive disorder, recurrent, mild: Secondary | ICD-10-CM | POA: Diagnosis not present

## 2017-10-09 ENCOUNTER — Ambulatory Visit: Payer: 59 | Admitting: Psychiatry

## 2017-10-09 ENCOUNTER — Other Ambulatory Visit: Payer: Self-pay

## 2017-10-09 ENCOUNTER — Encounter: Payer: Self-pay | Admitting: Psychiatry

## 2017-10-09 VITALS — BP 158/104 | HR 79 | Temp 98.0°F | Wt 219.0 lb

## 2017-10-09 DIAGNOSIS — F411 Generalized anxiety disorder: Secondary | ICD-10-CM | POA: Diagnosis not present

## 2017-10-09 DIAGNOSIS — F4310 Post-traumatic stress disorder, unspecified: Secondary | ICD-10-CM

## 2017-10-09 DIAGNOSIS — F330 Major depressive disorder, recurrent, mild: Secondary | ICD-10-CM | POA: Diagnosis not present

## 2017-10-09 MED ORDER — BUSPIRONE HCL 10 MG PO TABS: 10.0000 mg | ORAL_TABLET | Freq: Three times a day (TID) | ORAL | 1 refills | Status: DC

## 2017-10-09 NOTE — Patient Instructions (Signed)
Buspirone tablets What is this medicine? BUSPIRONE (byoo SPYE rone) is used to treat anxiety disorders. This medicine may be used for other purposes; ask your health care provider or pharmacist if you have questions. COMMON BRAND NAME(S): BuSpar What should I tell my health care provider before I take this medicine? They need to know if you have any of these conditions: -kidney or liver disease -an unusual or allergic reaction to buspirone, other medicines, foods, dyes, or preservatives -pregnant or trying to get pregnant -breast-feeding How should I use this medicine? Take this medicine by mouth with a glass of water. Follow the directions on the prescription label. You may take this medicine with or without food. To ensure that this medicine always works the same way for you, you should take it either always with or always without food. Take your doses at regular intervals. Do not take your medicine more often than directed. Do not stop taking except on the advice of your doctor or health care professional. Talk to your pediatrician regarding the use of this medicine in children. Special care may be needed. Overdosage: If you think you have taken too much of this medicine contact a poison control center or emergency room at once. NOTE: This medicine is only for you. Do not share this medicine with others. What if I miss a dose? If you miss a dose, take it as soon as you can. If it is almost time for your next dose, take only that dose. Do not take double or extra doses. What may interact with this medicine? Do not take this medicine with any of the following medications: -linezolid -MAOIs like Carbex, Eldepryl, Marplan, Nardil, and Parnate -methylene blue -procarbazine This medicine may also interact with the following medications: -diazepam -digoxin -diltiazem -erythromycin -grapefruit juice -haloperidol -medicines for mental depression or mood problems -medicines for seizures like  carbamazepine, phenobarbital and phenytoin -nefazodone -other medications for anxiety -rifampin -ritonavir -some antifungal medicines like itraconazole, ketoconazole, and voriconazole -verapamil -warfarin This list may not describe all possible interactions. Give your health care provider a list of all the medicines, herbs, non-prescription drugs, or dietary supplements you use. Also tell them if you smoke, drink alcohol, or use illegal drugs. Some items may interact with your medicine. What should I watch for while using this medicine? Visit your doctor or health care professional for regular checks on your progress. It may take 1 to 2 weeks before your anxiety gets better. You may get drowsy or dizzy. Do not drive, use machinery, or do anything that needs mental alertness until you know how this drug affects you. Do not stand or sit up quickly, especially if you are an older patient. This reduces the risk of dizzy or fainting spells. Alcohol can make you more drowsy and dizzy. Avoid alcoholic drinks. What side effects may I notice from receiving this medicine? Side effects that you should report to your doctor or health care professional as soon as possible: -blurred vision or other vision changes -chest pain -confusion -difficulty breathing -feelings of hostility or anger -muscle aches and pains -numbness or tingling in hands or feet -ringing in the ears -skin rash and itching -vomiting -weakness Side effects that usually do not require medical attention (report to your doctor or health care professional if they continue or are bothersome): -disturbed dreams, nightmares -headache -nausea -restlessness or nervousness -sore throat and nasal congestion -stomach upset This list may not describe all possible side effects. Call your doctor for medical advice about side   effects. You may report side effects to FDA at 1-800-FDA-1088. Where should I keep my medicine? Keep out of the reach  of children. Store at room temperature below 30 degrees C (86 degrees F). Protect from light. Keep container tightly closed. Throw away any unused medicine after the expiration date. NOTE: This sheet is a summary. It may not cover all possible information. If you have questions about this medicine, talk to your doctor, pharmacist, or health care provider.  2018 Elsevier/Gold Standard (2010-01-28 18:06:11)  
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
## 2017-10-23 ENCOUNTER — Encounter: Payer: Self-pay | Admitting: Psychiatry

## 2017-10-23 ENCOUNTER — Ambulatory Visit: Payer: 59 | Admitting: Psychiatry

## 2017-10-23 ENCOUNTER — Other Ambulatory Visit: Payer: Self-pay

## 2017-10-23 VITALS — BP 149/104 | HR 83 | Temp 97.7°F | Wt 219.0 lb

## 2017-10-23 DIAGNOSIS — F330 Major depressive disorder, recurrent, mild: Secondary | ICD-10-CM

## 2017-10-23 DIAGNOSIS — F411 Generalized anxiety disorder: Secondary | ICD-10-CM

## 2017-10-23 DIAGNOSIS — R030 Elevated blood-pressure reading, without diagnosis of hypertension: Secondary | ICD-10-CM | POA: Diagnosis not present

## 2017-10-23 DIAGNOSIS — F4310 Post-traumatic stress disorder, unspecified: Secondary | ICD-10-CM

## 2017-10-23 MED ORDER — BUSPIRONE HCL 10 MG PO TABS: 10.0000 mg | ORAL_TABLET | Freq: Three times a day (TID) | ORAL | 0 refills | Status: DC
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
## 2017-10-25 NOTE — Progress Notes (Signed)
  THERAPIST PROGRESS NOTE   Date of Service:   10/03/2017  Session Time:   1 hour  Patient:   Seth [name]   DOB:   [birth year]  MR Number:  [id number]  Location[facility]  [facility] [place] [address] Dept: [phone]            Provider/Observer:  Lubertha South Counselor  Risk of Suicide/Violence: virtually non-existent  Diagnosis:    MDD (major depressive disorder), recurrent episode, mild ([place])  Type of Therapy: Individual Therapy  Treatment Goals addressed: Anxiety and Coping  Participation Level: Active    Behavioral Observation: Seth [name]  presents as a 37 y.o.-year-old  Interventions: CBT and Motivational Interviewing   Behavioral Response: CasualAlertDepressed   Summary: Patient was tearful while discussing his current stressors.  He reports several family members in the hospital and all of the pressure that is on him at home.  He reports that he is able to engage in enjoyable activities for a short while until his wife interrupts.  He reports that his son is involved in a lot of activities that he struggles with finding time for himself.  He was able to list extended family pressure.  He reports that his childhood was not good and states that he attempts to support his parents but his siblings live in the home and at times take over.  Explored continued use of coping skills and when he should use them.     Plan: Seth [name] will use coping skills to manage symptoms   Return again in 2 weeks.

## 2017-11-01 DIAGNOSIS — R05 Cough: Secondary | ICD-10-CM | POA: Diagnosis not present

## 2017-11-01 DIAGNOSIS — R112 Nausea with vomiting, unspecified: Secondary | ICD-10-CM | POA: Diagnosis not present

## 2017-11-01 DIAGNOSIS — J019 Acute sinusitis, unspecified: Secondary | ICD-10-CM | POA: Diagnosis not present

## 2017-11-06 DIAGNOSIS — E559 Vitamin D deficiency, unspecified: Secondary | ICD-10-CM | POA: Diagnosis not present

## 2017-11-08 ENCOUNTER — Other Ambulatory Visit: Payer: Self-pay | Admitting: Psychiatry

## 2017-11-08 DIAGNOSIS — F4310 Post-traumatic stress disorder, unspecified: Secondary | ICD-10-CM

## 2017-11-14 ENCOUNTER — Other Ambulatory Visit: Payer: Self-pay | Admitting: Psychiatry

## 2017-11-14 DIAGNOSIS — F4310 Post-traumatic stress disorder, unspecified: Secondary | ICD-10-CM

## 2017-11-14 DIAGNOSIS — F330 Major depressive disorder, recurrent, mild: Secondary | ICD-10-CM

## 2017-11-22 DIAGNOSIS — I10 Essential (primary) hypertension: Secondary | ICD-10-CM | POA: Diagnosis not present

## 2017-11-28 ENCOUNTER — Other Ambulatory Visit: Payer: Self-pay | Admitting: Psychiatry

## 2017-11-28 DIAGNOSIS — F330 Major depressive disorder, recurrent, mild: Secondary | ICD-10-CM

## 2017-11-30 ENCOUNTER — Ambulatory Visit: Payer: 59 | Admitting: Licensed Clinical Social Worker

## 2017-11-30 DIAGNOSIS — F4310 Post-traumatic stress disorder, unspecified: Secondary | ICD-10-CM

## 2017-11-30 DIAGNOSIS — F330 Major depressive disorder, recurrent, mild: Secondary | ICD-10-CM

## 2017-12-16 ENCOUNTER — Other Ambulatory Visit: Payer: Self-pay | Admitting: Psychiatry

## 2017-12-16 DIAGNOSIS — F4310 Post-traumatic stress disorder, unspecified: Secondary | ICD-10-CM

## 2017-12-31 DIAGNOSIS — R079 Chest pain, unspecified: Secondary | ICD-10-CM | POA: Diagnosis not present

## 2017-12-31 DIAGNOSIS — R61 Generalized hyperhidrosis: Secondary | ICD-10-CM | POA: Diagnosis not present

## 2017-12-31 DIAGNOSIS — Z79899 Other long term (current) drug therapy: Secondary | ICD-10-CM | POA: Diagnosis not present

## 2017-12-31 DIAGNOSIS — R0602 Shortness of breath: Secondary | ICD-10-CM | POA: Diagnosis not present

## 2017-12-31 DIAGNOSIS — R0789 Other chest pain: Secondary | ICD-10-CM | POA: Diagnosis not present

## 2017-12-31 DIAGNOSIS — Z5181 Encounter for therapeutic drug level monitoring: Secondary | ICD-10-CM | POA: Diagnosis not present

## 2018-01-02 NOTE — Progress Notes (Signed)
   THERAPIST PROGRESS NOTE  Session Time: 88min  Participation Level: Active  Behavioral Response: CasualAlertEuthymic  Type of Therapy: Individual Therapy  Treatment Goals addressed: Coping  Interventions: CBT and Motivational Interviewing  Summary: Seth [name] is a 37 y.o. male who presents with a reduction in his symptoms.  Patient reports a "favorable" mood.  Patient reports that he has had a lot of good days.  He reports that his work schedule will change soon and that he is looking forward to the summer activities that he has planned.  He reports being able to use his coping skills and acknowledging his triggers before they cause a flare up.  Patient expressed his concerns with his relationships (wife, siblings and parents).  He reports that he continues to be a support to his wife who lost a family members recently.  He reports being able to engage in family activities without sadness.  Suicidal/Homicidal: No  Plan: Return again in 2 weeks.  Diagnosis: Axis I: Post Traumatic Stress Disorder and Depression    Axis II: No diagnosis    Lubertha South, LCSW 11/30/2017

## 2018-01-02 NOTE — Telephone Encounter (Signed)
error 

## 2018-01-15 DIAGNOSIS — E559 Vitamin D deficiency, unspecified: Secondary | ICD-10-CM | POA: Diagnosis not present

## 2018-01-18 ENCOUNTER — Other Ambulatory Visit: Payer: Self-pay | Admitting: Psychiatry

## 2018-01-18 DIAGNOSIS — F4310 Post-traumatic stress disorder, unspecified: Secondary | ICD-10-CM

## 2018-01-18 DIAGNOSIS — F330 Major depressive disorder, recurrent, mild: Secondary | ICD-10-CM

## 2018-01-24 ENCOUNTER — Other Ambulatory Visit: Payer: Self-pay | Admitting: Psychiatry

## 2018-01-24 DIAGNOSIS — F4310 Post-traumatic stress disorder, unspecified: Secondary | ICD-10-CM

## 2018-02-11 ENCOUNTER — Other Ambulatory Visit: Payer: Self-pay | Admitting: Psychiatry

## 2018-02-11 DIAGNOSIS — F330 Major depressive disorder, recurrent, mild: Secondary | ICD-10-CM

## 2018-03-02 DIAGNOSIS — E782 Mixed hyperlipidemia: Secondary | ICD-10-CM | POA: Diagnosis not present

## 2018-03-13 ENCOUNTER — Other Ambulatory Visit: Payer: Self-pay | Admitting: Psychiatry

## 2018-03-13 DIAGNOSIS — F330 Major depressive disorder, recurrent, mild: Secondary | ICD-10-CM

## 2018-03-13 DIAGNOSIS — F4310 Post-traumatic stress disorder, unspecified: Secondary | ICD-10-CM

## 2018-05-23 ENCOUNTER — Other Ambulatory Visit: Payer: Self-pay | Admitting: Psychiatry

## 2018-05-23 DIAGNOSIS — F330 Major depressive disorder, recurrent, mild: Secondary | ICD-10-CM

## 2018-07-09 DIAGNOSIS — J069 Acute upper respiratory infection, unspecified: Secondary | ICD-10-CM | POA: Diagnosis not present

## 2018-07-09 DIAGNOSIS — J018 Other acute sinusitis: Secondary | ICD-10-CM | POA: Diagnosis not present

## 2018-07-12 DIAGNOSIS — E782 Mixed hyperlipidemia: Secondary | ICD-10-CM | POA: Diagnosis not present

## 2018-08-27 ENCOUNTER — Other Ambulatory Visit: Payer: Self-pay | Admitting: Psychiatry

## 2018-08-27 DIAGNOSIS — F4310 Post-traumatic stress disorder, unspecified: Secondary | ICD-10-CM

## 2018-08-30 ENCOUNTER — Other Ambulatory Visit: Payer: Self-pay | Admitting: Psychiatry

## 2018-08-30 DIAGNOSIS — F330 Major depressive disorder, recurrent, mild: Secondary | ICD-10-CM

## 2018-09-03 DIAGNOSIS — I10 Essential (primary) hypertension: Secondary | ICD-10-CM | POA: Diagnosis not present

## 2018-09-03 DIAGNOSIS — Z0000 Encounter for general adult medical examination without abnormal findings: Secondary | ICD-10-CM | POA: Diagnosis not present

## 2018-09-03 DIAGNOSIS — E782 Mixed hyperlipidemia: Secondary | ICD-10-CM | POA: Diagnosis not present

## 2019-11-29 ENCOUNTER — Other Ambulatory Visit: Payer: Self-pay | Admitting: Family Medicine

## 2019-11-29 DIAGNOSIS — R748 Abnormal levels of other serum enzymes: Secondary | ICD-10-CM

## 2019-12-20 ENCOUNTER — Ambulatory Visit
Admission: RE | Admit: 2019-12-20 | Discharge: 2019-12-20 | Disposition: A | Discharge status: Home / Self Care | Payer: 59 | Source: Ambulatory Visit | Attending: Family Medicine | Admitting: Family Medicine

## 2019-12-20 DIAGNOSIS — R748 Abnormal levels of other serum enzymes: Secondary | ICD-10-CM

## 2020-04-13 ENCOUNTER — Other Ambulatory Visit (HOSPITAL_COMMUNITY): Payer: Self-pay | Admitting: Gastroenterology

## 2020-04-13 DIAGNOSIS — R7989 Other specified abnormal findings of blood chemistry: Secondary | ICD-10-CM

## 2020-04-22 ENCOUNTER — Other Ambulatory Visit: Payer: Self-pay | Admitting: Radiology

## 2020-04-23 ENCOUNTER — Other Ambulatory Visit: Payer: Self-pay | Admitting: Radiology

## 2020-04-24 ENCOUNTER — Encounter (HOSPITAL_COMMUNITY): Payer: Self-pay

## 2020-04-24 ENCOUNTER — Other Ambulatory Visit: Payer: Self-pay

## 2020-04-24 ENCOUNTER — Ambulatory Visit (HOSPITAL_COMMUNITY)
Admission: RE | Admit: 2020-04-24 | Discharge: 2020-04-24 | Disposition: A | Discharge status: Home / Self Care | Payer: 59 | Source: Ambulatory Visit | Attending: Gastroenterology | Admitting: Gastroenterology

## 2020-04-24 DIAGNOSIS — Z885 Allergy status to narcotic agent status: Secondary | ICD-10-CM | POA: Diagnosis not present

## 2020-04-24 DIAGNOSIS — Z79899 Other long term (current) drug therapy: Secondary | ICD-10-CM | POA: Insufficient documentation

## 2020-04-24 DIAGNOSIS — K760 Fatty (change of) liver, not elsewhere classified: Secondary | ICD-10-CM | POA: Insufficient documentation

## 2020-04-24 DIAGNOSIS — R7989 Other specified abnormal findings of blood chemistry: Secondary | ICD-10-CM | POA: Diagnosis present

## 2020-04-24 DIAGNOSIS — Z87891 Personal history of nicotine dependence: Secondary | ICD-10-CM | POA: Diagnosis not present

## 2020-04-24 DIAGNOSIS — R748 Abnormal levels of other serum enzymes: Secondary | ICD-10-CM | POA: Insufficient documentation

## 2020-04-24 DIAGNOSIS — Z888 Allergy status to other drugs, medicaments and biological substances status: Secondary | ICD-10-CM | POA: Insufficient documentation

## 2020-04-24 LAB — PROTIME-INR
INR: 1 (ref 0.8–1.2)
Prothrombin Time: 12.9 seconds (ref 11.4–15.2)

## 2020-04-24 LAB — CBC
HCT: 43.8 % (ref 39.0–52.0)
Hemoglobin: 14.8 g/dL (ref 13.0–17.0)
MCH: 30.7 pg (ref 26.0–34.0)
MCHC: 33.8 g/dL (ref 30.0–36.0)
MCV: 90.9 fL (ref 80.0–100.0)
Platelets: 257 10*3/uL (ref 150–400)
RBC: 4.82 MIL/uL (ref 4.22–5.81)
RDW: 11.7 % (ref 11.5–15.5)
WBC: 9.6 10*3/uL (ref 4.0–10.5)
nRBC: 0 % (ref 0.0–0.2)

## 2020-04-24 MED ORDER — SODIUM CHLORIDE 0.9 % IV SOLN
INTRAVENOUS | Status: AC | PRN
  Administered 2020-04-24: 10 mL/h via INTRAVENOUS

## 2020-04-24 MED ORDER — MIDAZOLAM HCL 2 MG/2ML IJ SOLN
INTRAMUSCULAR | Status: AC
  Filled 2020-04-24: qty 2

## 2020-04-24 MED ORDER — FENTANYL CITRATE (PF) 100 MCG/2ML IJ SOLN
INTRAMUSCULAR | Status: AC | PRN
Start: 2020-04-24 — End: 2020-04-24
  Administered 2020-04-24 (×2): 25 ug via INTRAVENOUS
  Administered 2020-04-24: 50 ug via INTRAVENOUS

## 2020-04-24 MED ORDER — HYDROCODONE-ACETAMINOPHEN 5-325 MG PO TABS
1.0000 | ORAL_TABLET | ORAL | Status: DC | PRN
  Administered 2020-04-24: 1 via ORAL
  Filled 2020-04-24: qty 1

## 2020-04-24 MED ORDER — SODIUM CHLORIDE 0.9 % IV SOLN: INTRAVENOUS | Status: DC

## 2020-04-24 MED ORDER — GELATIN ABSORBABLE 12-7 MM EX MISC
CUTANEOUS | Status: AC
  Filled 2020-04-24: qty 1

## 2020-04-24 MED ORDER — MIDAZOLAM HCL 2 MG/2ML IJ SOLN
INTRAMUSCULAR | Status: AC | PRN
  Administered 2020-04-24: 1 mg via INTRAVENOUS
  Administered 2020-04-24: 0.5 mg via INTRAVENOUS

## 2020-04-24 MED ORDER — LIDOCAINE HCL (PF) 1 % IJ SOLN
INTRAMUSCULAR | Status: AC
  Filled 2020-04-24: qty 30

## 2020-04-24 MED ORDER — FENTANYL CITRATE (PF) 100 MCG/2ML IJ SOLN
INTRAMUSCULAR | Status: DC
Start: 2020-04-24 — End: 2020-04-25
  Filled 2020-04-24: qty 2

## 2020-04-24 NOTE — Progress Notes (Signed)
Patient was given discharge instructions. He verbalized understanding. 

## 2020-04-24 NOTE — Discharge Instructions (Signed)

## 2020-04-24 NOTE — Sedation Documentation (Signed)
Dr Kathlene Cote aware patient having 6/10 pain at liver biopsy site (had Gelfoam inserted at site). To give 25 mcg of Fentanyl and he will put an order in for pain meds for Short Stay

## 2020-04-24 NOTE — H&P (Signed)
Chief Complaint: Patient was seen in consultation today for liver biopsy at the request of [facility]  Referring Physician(s): Arta Silence  Supervising Physician: Aletta Edouard  Patient Status: [facility] - Out-pt  History of Present Illness: Seth [name] is a 39 y.o. male being worked up for elevated liver enzymes. He is referred for image guided random liver biopsy PMHx, meds, labs, imaging, allergies reviewed. Feels well, no recent fevers, chills, illness. Has been NPO today as directed.   Past Medical History:  Diagnosis Date  . ADHD (attention deficit hyperactivity disorder)   . Ankle fracture, right 12/05/2015  . Anxiety   . Depression   . Hyperlipemia   . Hypertension   . Insomnia     Past Surgical History:  Procedure Laterality Date  . ORIF ANKLE FRACTURE Right 12/16/2015   Procedure: OPEN REDUCTION INTERNAL FIXATION (ORIF) RIGHT ANKLE FRACTURE;  Surgeon: [name], MD;  Location: [facility];  Service: Orthopedics;  Laterality: Right;  . ORIF CLAVICLE FRACTURE Right     Allergies: Tramadol  Medications: Prior to Admission medications   Medication Sig Start Date End Date Taking? Authorizing Provider  Bempedoic Acid (NEXLETOL) 180 MG TABS Take 180 mg by mouth daily.   Yes [provider]  buPROPion (WELLBUTRIN XL) 150 MG 24 hr tablet Take 1 tablet (150 mg total) by mouth daily. 09/15/17  Yes Ursula Alert, MD  diphenhydrAMINE-zinc acetate (BENADRYL) cream Apply 1 application topically 3 (three) times daily as needed for itching.   Yes [provider]  fenofibrate 160 MG tablet Take 160 mg by mouth daily.   Yes [provider]  hydrochlorothiazide (HYDRODIURIL) 25 MG tablet Take 25 mg by mouth daily.   Yes [provider]  ibuprofen (ADVIL) 200 MG tablet Take 800 mg by mouth every 6 (six) hours as needed for headache or moderate pain.   Yes [provider]  lisinopril (ZESTRIL) 40 MG tablet  Take 40 mg by mouth daily.   Yes [provider]  sertraline (ZOLOFT) 100 MG tablet Take 200 mg by mouth daily. 12/01/19  Yes [provider]  traZODone (DESYREL) 50 MG tablet Take 1-2 tablets (50-100 mg total) by mouth at bedtime as needed for sleep. Patient taking differently: Take 50 mg by mouth at bedtime as needed for sleep.  09/15/17  Yes Eappen, Ria Clock, MD  busPIRone (BUSPAR) 10 MG tablet TAKE 1 TABLET BY MOUTH 3  TIMES DAILY Patient not taking: Reported on 04/21/2020 02/05/18   Ursula Alert, MD  hydrOXYzine (VISTARIL) 25 MG capsule Take 1 capsule (25 mg total) by mouth 2 (two) times daily as needed for anxiety. As well as insomnia at bedtime. Patient not taking: Reported on 04/21/2020 09/15/17   Ursula Alert, MD  prazosin (MINIPRESS) 1 MG capsule Take 1 capsule (1 mg total) by mouth at bedtime. Patient not taking: Reported on 04/21/2020 09/15/17   Ursula Alert, MD  sertraline (ZOLOFT) 50 MG tablet TAKE 1 AND 1/2 TABLETS BY  MOUTH DAILY Patient not taking: Reported on 04/21/2020 02/05/18   Ursula Alert, MD     Family History  Problem Relation Age of Onset  . ADD / ADHD Mother   . Anxiety disorder Mother   . Depression Mother   . Anxiety disorder Father   . Depression Father   . Bipolar disorder Sister   . Drug abuse Brother   . Drug abuse Brother     Social History   Socioeconomic History  . Marital status: Married  Spouse name: heather  . Number of children: 1  . Years of education: Not on file  . Highest education level: Associate degree: occupational, Hotel manager, or vocational program  Occupational History    Comment: full time  Tobacco Use  . Smoking status: Former Smoker    Quit date: 07/25/2002    Years since quitting: 17.7  . Smokeless tobacco: Never Used  Vaping Use  . Vaping Use: Never used  Substance and Sexual Activity  . Alcohol use: Yes    Alcohol/week: 8.0 standard drinks    Types: 6 Cans of beer, 2 Shots of liquor per week     Comment: occasionally  . Drug use: No  . Sexual activity: Yes    Birth control/protection: Condom  Other Topics Concern  . Not on file  Social History Narrative  . Not on file   Social Determinants of Health   Financial Resource Strain:   . Difficulty of Paying Living Expenses: Not on file  Food Insecurity:   . Worried About Charity fundraiser in the Last Year: Not on file  . Ran Out of Food in the Last Year: Not on file  Transportation Needs:   . Lack of Transportation (Medical): Not on file  . Lack of Transportation (Non-Medical): Not on file  Physical Activity:   . Days of Exercise per Week: Not on file  . Minutes of Exercise per Session: Not on file  Stress:   . Feeling of Stress : Not on file  Social Connections:   . Frequency of Communication with Friends and Family: Not on file  . Frequency of Social Gatherings with Friends and Family: Not on file  . Attends Religious Services: Not on file  . Active Member of Clubs or Organizations: Not on file  . Attends Archivist Meetings: Not on file  . Marital Status: Not on file     Review of Systems: A 12 point ROS discussed and pertinent positives are indicated in the HPI above.  All other systems are negative.  Review of Systems  Vital Signs: BP (!) 140/108   Pulse 77   Temp (!) 97.5 F (36.4 C) (Oral)   Resp 17   Ht 6\' 2"  (1.88 m)   Wt 102.1 kg   SpO2 98%   BMI 28.89 kg/m   Physical Exam Constitutional:      Appearance: Normal appearance.  HENT:     Mouth/Throat:     Mouth: Mucous membranes are moist.     Pharynx: Oropharynx is clear.  Cardiovascular:     Rate and Rhythm: Normal rate and regular rhythm.     Heart sounds: Normal heart sounds.  Pulmonary:     Effort: Pulmonary effort is normal. No respiratory distress.     Breath sounds: Normal breath sounds.  Abdominal:     General: There is no distension.     Palpations: Abdomen is soft. There is no mass.  Skin:    General: Skin is warm  and dry.  Neurological:     General: No focal deficit present.     Mental Status: He is alert and oriented to person, place, and time.  Psychiatric:        Mood and Affect: Mood normal.        Thought Content: Thought content normal.        Judgment: Judgment normal.      Imaging: No results found.  Labs:  CBC: Recent Labs    04/24/20 1117  WBC 9.6  HGB 14.8  HCT 43.8  PLT 257    COAGS: No results for input(s): INR, APTT in the last 8760 hours.  BMP: No results for input(s): NA, K, CL, CO2, GLUCOSE, BUN, CALCIUM, CREATININE, GFRNONAA, GFRAA in the last 8760 hours.  Invalid input(s): CMP  LIVER FUNCTION TESTS: No results for input(s): BILITOT, AST, ALT, ALKPHOS, PROT, ALBUMIN in the last 8760 hours.  TUMOR MARKERS: No results for input(s): AFPTM, CEA, CA199, CHROMGRNA in the last 8760 hours.  Assessment and Plan: Elevated LFTs For US guided random liver biopsy Labs pending Risks and benefits of liver biopsy was discussed with the patient and/or patient's family including, but not limited to bleeding, infection, damage to adjacent structures or low yield requiring additional tests.  All of the questions were answered and there is agreement to proceed.  Consent signed and in chart.    Thank you for this interesting consult.  I greatly enjoyed meeting BRNADON EOFF and look forward to participating in their care.  A copy of this report was sent to the requesting provider on this date.  Electronically Signed: Ascencion Dike, PA-C 04/24/2020, 12:05 PM   I spent a total of 20 minutes in face to face in clinical consultation, greater than 50% of which was counseling/coordinating care for liver biopsy

## 2020-04-24 NOTE — Procedures (Signed)
Interventional Radiology Procedure Note ° °Procedure: US Guided Biopsy of liver ° °Complications: None ° °Estimated Blood Loss: < 10 mL ° °Findings: °18 G core biopsy of liver performed under US guidance.  Three core samples obtained and sent to Pathology. ° °Seth [name] T. Elmon Shader, M.D °Pager:  [phone] ° °  °

## 2020-04-25 ENCOUNTER — Emergency Department (HOSPITAL_BASED_OUTPATIENT_CLINIC_OR_DEPARTMENT_OTHER)
Admission: EM | Admit: 2020-04-25 | Discharge: 2020-04-25 | Disposition: A | Discharge status: Home / Self Care | Payer: 59 | Attending: Emergency Medicine | Admitting: Emergency Medicine

## 2020-04-25 ENCOUNTER — Encounter (HOSPITAL_BASED_OUTPATIENT_CLINIC_OR_DEPARTMENT_OTHER): Payer: Self-pay | Admitting: *Deleted

## 2020-04-25 ENCOUNTER — Emergency Department (HOSPITAL_BASED_OUTPATIENT_CLINIC_OR_DEPARTMENT_OTHER): Payer: 59

## 2020-04-25 ENCOUNTER — Other Ambulatory Visit: Payer: Self-pay

## 2020-04-25 DIAGNOSIS — R110 Nausea: Secondary | ICD-10-CM | POA: Diagnosis not present

## 2020-04-25 DIAGNOSIS — Z87891 Personal history of nicotine dependence: Secondary | ICD-10-CM | POA: Diagnosis not present

## 2020-04-25 DIAGNOSIS — Z79899 Other long term (current) drug therapy: Secondary | ICD-10-CM | POA: Insufficient documentation

## 2020-04-25 DIAGNOSIS — I10 Essential (primary) hypertension: Secondary | ICD-10-CM | POA: Diagnosis not present

## 2020-04-25 DIAGNOSIS — R1011 Right upper quadrant pain: Secondary | ICD-10-CM | POA: Diagnosis present

## 2020-04-25 DIAGNOSIS — G8918 Other acute postprocedural pain: Secondary | ICD-10-CM | POA: Insufficient documentation

## 2020-04-25 LAB — COMPREHENSIVE METABOLIC PANEL
ALT: 54 U/L — ABNORMAL HIGH (ref 0–44)
AST: 39 U/L (ref 15–41)
Albumin: 4.6 g/dL (ref 3.5–5.0)
Alkaline Phosphatase: 42 U/L (ref 38–126)
Anion gap: 10 (ref 5–15)
BUN: 14 mg/dL (ref 6–20)
CO2: 24 mmol/L (ref 22–32)
Calcium: 9.4 mg/dL (ref 8.9–10.3)
Chloride: 101 mmol/L (ref 98–111)
Creatinine, Ser: 0.75 mg/dL (ref 0.61–1.24)
GFR, Estimated: >60 mL/min (ref >60)
Glucose, Bld: 102 mg/dL — ABNORMAL HIGH (ref 70–99)
Potassium: 3.9 mmol/L (ref 3.5–5.1)
Sodium: 135 mmol/L (ref 135–145)
Total Bilirubin: 0.6 mg/dL (ref 0.3–1.2)
Total Protein: 7.5 g/dL (ref 6.5–8.1)

## 2020-04-25 LAB — CBC
HCT: 40.2 % (ref 39.0–52.0)
Hemoglobin: 14 g/dL (ref 13.0–17.0)
MCH: 31.2 pg (ref 26.0–34.0)
MCHC: 34.8 g/dL (ref 30.0–36.0)
MCV: 89.5 fL (ref 80.0–100.0)
Platelets: 219 10*3/uL (ref 150–400)
RBC: 4.49 MIL/uL (ref 4.22–5.81)
RDW: 11.9 % (ref 11.5–15.5)
WBC: 12.8 10*3/uL — ABNORMAL HIGH (ref 4.0–10.5)
nRBC: 0 % (ref 0.0–0.2)

## 2020-04-25 MED ORDER — HYDROCODONE-ACETAMINOPHEN 5-325 MG PO TABS
1.0000 | ORAL_TABLET | Freq: Four times a day (QID) | ORAL | 0 refills | Status: DC | PRN
Start: 2020-04-25 — End: 2020-04-25

## 2020-04-25 MED ORDER — IOHEXOL 300 MG/ML  SOLN
100.0000 mL | Freq: Once | INTRAMUSCULAR | Status: AC | PRN
  Administered 2020-04-25: 100 mL via INTRAVENOUS

## 2020-04-25 MED ORDER — MORPHINE SULFATE (PF) 4 MG/ML IV SOLN
4.0000 mg | Freq: Once | INTRAVENOUS | Status: DC
  Filled 2020-04-25: qty 1

## 2020-04-25 MED ORDER — HYDROCODONE-ACETAMINOPHEN 5-325 MG PO TABS
1.0000 | ORAL_TABLET | Freq: Four times a day (QID) | ORAL | 0 refills | Status: DC | PRN
Start: 2020-04-25 — End: 2021-06-02

## 2020-04-25 MED ORDER — MORPHINE SULFATE (PF) 4 MG/ML IV SOLN
4.0000 mg | Freq: Once | INTRAVENOUS | Status: AC
  Administered 2020-04-25: 4 mg via INTRAVENOUS

## 2020-04-25 NOTE — ED Triage Notes (Signed)
Pt reports he had a liver biopsy yesterday. He had some post procedure pain which resolved. He had pain last night which resolved with tylenol. Pain returned this morning and he took 1000mg  tylenol at 10 am. Pain is still increasing. His doctor advised him to come in for eval. Endorses nausea

## 2020-04-25 NOTE — ED Notes (Signed)
ED Provider at bedside. 

## 2020-04-25 NOTE — Discharge Instructions (Addendum)
Your imaging and laboratory work-up obtained here in the ED was reassuring.  As was your physical exam and vital signs.  Please take the Vicodin as needed for pain symptoms.  You will need to be seen by Dr. Paulita Fujita or another provider from [facility] gastroenterology next week for ongoing evaluation and management of your postoperative pain.  Please also notify your primary care provider today's encounter.  Return to the ED or seek immediate medical attention should you experience any new or worsening symptoms.

## 2020-04-25 NOTE — ED Provider Notes (Signed)
[facility] EMERGENCY DEPARTMENT Provider Note   CSN: [id number] Arrival date & time: 04/25/20  1513     History Chief Complaint  Patient presents with  . Post-op Problem    Seth [name] is a 39 y.o. male followed by [facility] who had a liver biopsy performed yesterday 04/24/2020 by Dr. Paulita Fujita with [facility] who presents the ED with right upper quadrant postop pain.  On my exam, patient states that he had his procedure performed at 1 PM and after discharge from the hospital was feeling fine.  However, at approximately 10 PM last evening, he developed worsening abdominal pain.  He states that it has continued to progress and he has pain involving his entire right side of abdomen with radiation to right flank.  He states that his symptoms are worse with any form of exertion, including sitting up or taking deep inspiration.  He feels nauseated, but denies any emesis.  No fevers or chills.  He was able to eat a chicken sandwich today.  He has not had a bowel movement yet, but has been passing gas.  No new urinary complaints.  He has only been taking Tylenol for his symptoms of pain, which has failed to alleviate his discomfort.  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  Patient reports that he called Dr. Paulita Fujita who advised him to come to the ED for evaluation and assessment.   HPI     Past Medical History:  Diagnosis Date  . ADHD (attention deficit hyperactivity disorder)   . Ankle fracture, right 12/05/2015  . Anxiety   . Depression   . Hyperlipemia   . Hypertension   . Insomnia     Patient Active Problem List   Diagnosis Date Noted  . Wrist pain, left 10/18/2016  . Lateral epicondylitis of left elbow 10/18/2016  . Elbow pain, left 10/18/2016    Past Surgical History:  Procedure Laterality Date  . ORIF ANKLE FRACTURE Right 12/16/2015   Procedure: OPEN REDUCTION INTERNAL FIXATION  (ORIF) RIGHT ANKLE FRACTURE;  Surgeon: [name], MD;  Location: [facility];  Service: Orthopedics;  Laterality: Right;  . ORIF CLAVICLE FRACTURE Right        Family History  Problem Relation Age of Onset  . ADD / ADHD Mother   . Anxiety disorder Mother   . Depression Mother   . Anxiety disorder Father   . Depression Father   . Bipolar disorder Sister   . Drug abuse Brother   . Drug abuse Brother     Social History   Tobacco Use  . Smoking status: Former Smoker    Quit date: 07/25/2002    Years since quitting: 17.7  . Smokeless tobacco: Never Used  Vaping Use  . Vaping Use: Never used  Substance Use Topics  . Alcohol use: Yes    Alcohol/week: 8.0 standard drinks    Types: 6 Cans of beer, 2 Shots of liquor per week    Comment: occasionally  . Drug use: No    Home Medications Prior to Admission medications   Medication Sig Start Date End Date Taking? Authorizing Provider  Bempedoic Acid (NEXLETOL) 180 MG TABS Take 180 mg by mouth daily.    [provider]  buPROPion (WELLBUTRIN XL) 150 MG 24 hr tablet Take 1 tablet (150 mg total) by mouth daily. 09/15/17   Ursula Alert, MD  busPIRone (BUSPAR)  10 MG tablet TAKE 1 TABLET BY MOUTH 3  TIMES DAILY Patient not taking: Reported on 04/21/2020 02/05/18   Ursula Alert, MD  diphenhydrAMINE-zinc acetate (BENADRYL) cream Apply 1 application topically 3 (three) times daily as needed for itching.    [provider]  fenofibrate 160 MG tablet Take 160 mg by mouth daily.    [provider]  hydrochlorothiazide (HYDRODIURIL) 25 MG tablet Take 25 mg by mouth daily.    [provider]  HYDROcodone-acetaminophen (NORCO/VICODIN) 5-325 MG tablet Take 1 tablet by mouth every 6 (six) hours as needed for severe pain. 04/25/20   Corena Herter, PA-C  hydrOXYzine (VISTARIL) 25 MG capsule Take 1 capsule (25 mg total) by mouth 2 (two) times daily as needed for anxiety. As well as insomnia at  bedtime. Patient not taking: Reported on 04/21/2020 09/15/17   Ursula Alert, MD  ibuprofen (ADVIL) 200 MG tablet Take 800 mg by mouth every 6 (six) hours as needed for headache or moderate pain.    [provider]  lisinopril (ZESTRIL) 40 MG tablet Take 40 mg by mouth daily.    [provider]  prazosin (MINIPRESS) 1 MG capsule Take 1 capsule (1 mg total) by mouth at bedtime. Patient not taking: Reported on 04/21/2020 09/15/17   Ursula Alert, MD  sertraline (ZOLOFT) 100 MG tablet Take 200 mg by mouth daily. 12/01/19   [provider]  traZODone (DESYREL) 50 MG tablet Take 1-2 tablets (50-100 mg total) by mouth at bedtime as needed for sleep. Patient taking differently: Take 50 mg by mouth at bedtime as needed for sleep.  09/15/17   Ursula Alert, MD    Allergies    Tramadol  Review of Systems   Review of Systems  All other systems reviewed and are negative.   Physical Exam Updated Vital Signs BP 111/74 (BP Location: Right Arm)   Pulse 69   Temp 97.8 F (36.6 C) (Oral)   Resp 18   Ht 6\' 2"  (1.88 m)   Wt 102.1 kg   SpO2 98%   BMI 28.89 kg/m   Physical Exam Vitals and nursing note reviewed. Exam conducted with a chaperone present.  Constitutional:      Appearance: Normal appearance.  HENT:     Head: Normocephalic and atraumatic.  Eyes:     General: No scleral icterus.    Conjunctiva/sclera: Conjunctivae normal.  Cardiovascular:     Rate and Rhythm: Normal rate and regular rhythm.     Pulses: Normal pulses.     Heart sounds: Normal heart sounds.  Pulmonary:     Effort: Pulmonary effort is normal. No respiratory distress.     Breath sounds: Normal breath sounds.  Abdominal:     Comments: Soft, nondistended.  TTP diffusely over right side of abdomen.  Positive right-sided CVAT.  No left-sided abdominal TTP.  Residual Betadine, but no other overlying skin changes.  No rigidity noted.  No masses.  No induration.  Skin:    General: Skin is dry.      Capillary Refill: Capillary refill takes less than 2 seconds.  Neurological:     Mental Status: He is alert and oriented to person, place, and time.     GCS: GCS eye subscore is 4. GCS verbal subscore is 5. GCS motor subscore is 6.  Psychiatric:        Mood and Affect: Mood normal.        Behavior: Behavior normal.        Thought  Content: Thought content normal.     ED Results / Procedures / Treatments   Labs (all labs ordered are listed, but only abnormal results are displayed) Labs Reviewed  CBC - Abnormal; Notable for the following components:      Result Value   WBC 12.8 (*)    All other components within normal limits  COMPREHENSIVE METABOLIC PANEL - Abnormal; Notable for the following components:   Glucose, Bld 102 (*)    ALT 54 (*)    All other components within normal limits    EKG None  Radiology CT ABDOMEN PELVIS W CONTRAST  Result Date: 04/25/2020 CLINICAL DATA:  39 year old male with acute abdominal pain and distension. Status post liver biopsy yesterday. EXAM: CT ABDOMEN AND PELVIS WITH CONTRAST TECHNIQUE: Multidetector CT imaging of the abdomen and pelvis was performed using the standard protocol following bolus administration of intravenous contrast. CONTRAST:  145mL OMNIPAQUE IOHEXOL 300 MG/ML  SOLN COMPARISON:  04/24/2020 and prior ultrasounds. FINDINGS: Lower chest: No acute abnormality. Hepatobiliary: Hepatic steatosis is noted. Very mild heterogeneity of the posterior RIGHT liver is noted compatible with site of recent biopsy (series 2: Images 26-30). No other hepatic abnormalities are noted. There is no evidence of perihepatic fluid or subcapsular hematoma. The gallbladder is unremarkable.  No biliary dilatation. Pancreas: Unremarkable Spleen: Unremarkable Adrenals/Urinary Tract: The kidneys, adrenal glands and bladder are unremarkable. Stomach/Bowel: Stomach is within normal limits. Appendix appears normal. No evidence of bowel wall thickening, distention,  or inflammatory changes. Vascular/Lymphatic: No significant vascular findings are present. No enlarged abdominal or pelvic lymph nodes. Reproductive: Prostate is unremarkable. Other: No ascites, focal collection or pneumoperitoneum. Musculoskeletal: No acute or suspicious bony abnormality. IMPRESSION: 1. Very mild heterogeneity of the posterior RIGHT liver compatible with site of recent biopsy. No evidence of complicating features including pneumothorax, ascites/perihepatic fluid, subcapsular hematoma or pneumoperitoneum. 2. Hepatic steatosis. Electronically Signed   By: Margarette Canada M.D.   On: 04/25/2020 17:39   US BIOPSY (LIVER)  Result Date: 04/24/2020 INDICATION: Elevated liver function tests. EXAM: ULTRASOUND GUIDED CORE BIOPSY OF liver MEDICATIONS: None. ANESTHESIA/SEDATION: Fentanyl 75 mcg IV; Versed 1.5 mg IV Moderate Sedation Time:  12 minutes. The patient was continuously monitored during the procedure by the interventional radiology nurse under my direct supervision. PROCEDURE: The procedure, risks, benefits, and alternatives were explained to the patient. Questions regarding the procedure were encouraged and answered. The patient understands and consents to the procedure. A time-out was performed prior to initiating the procedure. Ultrasound was performed to localize the liver. The right abdominal wall was prepped with chlorhexidine in a sterile fashion, and a sterile drape was applied covering the operative field. A sterile gown and sterile gloves were used for the procedure. Local anesthesia was provided with 1% Lidocaine. Under ultrasound guidance, a 17 gauge trocar needle was advanced into the right lobe of the liver. After confirming needle tip position, 3 separate coaxial 18 gauge core biopsy samples were obtained and submitted in formalin. Gel-Foam pledgets were advanced through the outer needle as the needle was removed. COMPLICATIONS: None immediate. FINDINGS: Solid core biopsy specimens  were obtained. IMPRESSION: Ultrasound-guided core biopsy performed of the liver within right lobe parenchyma. Electronically Signed   By: Aletta Edouard M.D.   On: 04/24/2020 14:30    Procedures Procedures (including critical care time)  Medications Ordered in ED Medications  morphine 4 MG/ML injection 4 mg (4 mg Intravenous Given 04/25/20 1623)  iohexol (OMNIPAQUE) 300 MG/ML solution 100 mL (100 mLs Intravenous Contrast Given  04/25/20 1709)    ED Course  I have reviewed the triage vital signs and the nursing notes.  Pertinent labs & imaging results that were available during my care of the patient were reviewed by me and considered in my medical decision making (see chart for details).  Clinical Course as of Apr 25 1822  Sat Apr 25, 2020  1623 Spoke with Dr. Paulita Fujita who recommended CT abdomen and pelvis with contrast to assess for liver injury subsequent to his biopsy yesterday.  He informs me that he is reasonable for outpatient follow-up as long as there are no surgical complications or acute pathology on CT.   [GG]    Clinical Course User Index [GG] Corena Herter, PA-C   MDM Rules/Calculators/A&P                          Tylenol has been ineffective, will provide 4 mg IV morphine here in the ED for alleviation of his pain symptoms.  Will obtain basic laboratory work-up including CBC and CMP while awaiting hear back from Dr. Paulita Fujita, gastroenterology.  Spoke with Dr. Paulita Fujita who recommended CT abdomen and pelvis with contrast to assess for liver injury subsequent to his biopsy yesterday.  He informs me that he is reasonable for outpatient follow-up as long as there are no surgical complications or acute pathology on CT.  CT abdomen and pelvis is personally reviewed and demonstrates very mild heterogeneity of the right lobe parenchyma of liver, consistent with site of recent biopsy.  No evidence of complicating features including ascites or perihepatic fluid, pneumothorax,  subcapsular hematoma, or pneumoperitoneum.    Patient feels improved after morphine 4 mg administered here in the ED.  We will discharge him home with a short course of analgesics and advised him to follow-up with his gastroenterologist, Dr. Paulita Fujita, for ongoing evaluation and management.  Final Clinical Impression(s) / ED Diagnoses Final diagnoses:  Post-operative pain    Rx / DC Orders ED Discharge Orders         Ordered    HYDROcodone-acetaminophen (NORCO/VICODIN) 5-325 MG tablet  Every 6 hours PRN        04/25/20 1823           Corena Herter, PA-C 04/25/20 1823    Truddie Hidden, MD 04/25/20 2256

## 2020-04-27 ENCOUNTER — Other Ambulatory Visit: Payer: Self-pay | Admitting: Physician Assistant

## 2020-04-27 LAB — SURGICAL PATHOLOGY

## 2021-01-21 ENCOUNTER — Ambulatory Visit (HOSPITAL_BASED_OUTPATIENT_CLINIC_OR_DEPARTMENT_OTHER): Payer: 59 | Admitting: Internal Medicine

## 2021-06-02 ENCOUNTER — Encounter (HOSPITAL_BASED_OUTPATIENT_CLINIC_OR_DEPARTMENT_OTHER): Payer: Self-pay | Admitting: Internal Medicine

## 2021-06-02 ENCOUNTER — Other Ambulatory Visit: Payer: Self-pay

## 2021-06-02 ENCOUNTER — Ambulatory Visit (HOSPITAL_BASED_OUTPATIENT_CLINIC_OR_DEPARTMENT_OTHER): Payer: 59 | Admitting: Internal Medicine

## 2021-06-02 VITALS — BP 108/78 | HR 105 | Ht 74.0 in | Wt 234.0 lb

## 2021-06-02 DIAGNOSIS — K760 Fatty (change of) liver, not elsewhere classified: Secondary | ICD-10-CM | POA: Diagnosis not present

## 2021-06-02 DIAGNOSIS — E781 Pure hyperglyceridemia: Secondary | ICD-10-CM | POA: Diagnosis not present

## 2021-06-02 MED ORDER — FENOFIBRATE 48 MG PO TABS: 48.0000 mg | ORAL_TABLET | Freq: Every day | ORAL | 3 refills | Status: DC

## 2021-06-02 MED ORDER — ICOSAPENT ETHYL 1 G PO CAPS: 2.0000 g | ORAL_CAPSULE | Freq: Two times a day (BID) | ORAL | 3 refills | Status: DC

## 2021-06-02 NOTE — Progress Notes (Signed)
LIPID CLINIC CONSULT NOTE  Chief Complaint:  Manage dyslipidemia  Primary Care Physician: Seth Neer, MD  Primary Cardiologist:  None  HPI:  Seth [name] is a 40 y.o. male who is being seen today for the evaluation of dyslipidemia at the request of Seth Neer, MD. this is a pleasant 40 year old male with a history of dyslipidemia and particular high triglycerides and his mother and maternal grandfather.  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  He has taken this, fish oil and other treatments in the past but recently discontinued them as he felt that they were ineffective.  He also reported itching on rosuvastatin.  Seth [name] has no known coronary disease.  He does have a history of elevated liver enzymes and ultimately had a liver biopsy which showed no fibrosis.  Has been followed by Seth Locks, NP with hepatology in [place].  PMHx:  Past Medical History:  Diagnosis Date   ADHD (attention deficit hyperactivity disorder)    Ankle fracture, right 12/05/2015   Anxiety    Depression    Hyperlipemia    Hypertension    Insomnia     Past Surgical History:  Procedure Laterality Date   ORIF ANKLE FRACTURE Right 12/16/2015   Procedure: OPEN REDUCTION INTERNAL FIXATION (ORIF) RIGHT ANKLE FRACTURE;  Surgeon: Seth Koyanagi, MD;  Location: [facility];  Service: Orthopedics;  Laterality: Right;   ORIF CLAVICLE FRACTURE Right     FAMHx:  Family History  Problem Relation Age of Onset   ADD / ADHD Mother    Anxiety disorder Mother    Depression Mother    Anxiety disorder Father    Depression Father    Bipolar disorder Sister    Drug abuse Brother    Drug abuse Brother     SOCHx:   reports that he quit smoking about 18 years ago. His smoking use included cigarettes. He has never used smokeless tobacco. He reports current alcohol use of  about 8.0 standard drinks per week. He reports that he does not use drugs.  ALLERGIES:  Allergies  Allergen Reactions   Rosuvastatin Itching   Tramadol Itching    ROS: Pertinent items noted in HPI and remainder of comprehensive ROS otherwise negative.  HOME MEDS: Current Outpatient Medications on File Prior to Visit  Medication Sig Dispense Refill   ALPRAZolam (XANAX) 0.25 MG tablet Take 0.25 mg by mouth 2 (two) times daily. As needed     amLODipine (NORVASC) 10 MG tablet Take 10 mg by mouth daily.     desvenlafaxine (PRISTIQ) 100 MG 24 hr tablet Take 1 tablet by mouth daily.     diphenhydrAMINE-zinc acetate (BENADRYL) cream Apply 1 application topically 3 (three) times daily as needed for itching.     hydrochlorothiazide (HYDRODIURIL) 25 MG tablet Take 25 mg by mouth daily.     hydrOXYzine (VISTARIL) 25 MG capsule Take 1 capsule (25 mg total) by mouth 2 (two) times daily as needed for anxiety. As well as insomnia at bedtime. 60 capsule 0   ibuprofen (ADVIL) 200 MG tablet Take 800 mg by mouth every 6 (six) hours as needed for headache or moderate pain.     lisinopril (ZESTRIL) 40 MG tablet Take 40 mg by mouth daily.     traZODone (DESYREL) 50 MG tablet Take 1-2 tablets (50-100 mg total) by mouth at bedtime as needed for sleep. (  Patient taking differently: Take 50 mg by mouth at bedtime as needed for sleep.) 60 tablet 0   No current facility-administered medications on file prior to visit.    LABS/IMAGING: No results found for this or any previous visit (from the past 48 hour(s)). No results found.  LIPID PANEL: No results found for: CHOL, TRIG, HDL, CHOLHDL, VLDL, LDLCALC, LDLDIRECT  WEIGHTS: Wt Readings from Last 3 Encounters:  06/02/21 234 lb (106.1 kg)  04/25/20 225 lb (102.1 kg)  04/24/20 225 lb (102.1 kg)    VITALS: BP 108/78   Pulse (!) 105   Ht 6\' 2"  (1.88 m)   Wt 234 lb (106.1 kg)   SpO2 96%   BMI 30.04 kg/m   EXAM: General appearance: alert and no  distress Neck: no carotid bruit, no JVD, and thyroid not enlarged, symmetric, no tenderness/mass/nodules Lungs: clear to auscultation bilaterally Heart: regular rate and rhythm, S1, S2 normal, no murmur, click, rub or gallop Abdomen: soft, non-tender; bowel sounds normal; no masses,  no organomegaly Extremities: extremities normal, atraumatic, no cyanosis or edema Pulses: 2+ and symmetric Skin: Skin color, texture, turgor normal. No rashes or lesions Neurologic: Grossly normal Psych: Pleasant  EKG: Deferred  ASSESSMENT: Mixed dyslipidemia with high triglycerides Family history of premature coronary disease Statin and fenofibrate intolerance with elevated liver enzymes NAFLD-biopsy-proven  PLAN: 1.   [name] has a mixed dyslipidemia with high triglycerides and is at increased risk of heart disease.  He unfortunately cannot tolerate statins and fenofibrate due to elevated liver enzymes and felt that they were also ineffective.  Further lipid-lowering therapy is recommended.  Would like to consider lower dose fenofibrate 48 mg daily for his triglycerides.  We will need to monitor liver enzymes closely.  He has them regularly drawn by the hepatologist that he is seeing.  In addition would recommend Vascepa, purified EPA-2 g twice daily.  We will plan repeat lab work in about 3 to 4 months and liver enzymes at that time.  We might consider Livalo in the future since it is primarily metabolized outside of the liver and has less effects with regards to liver enzyme elevation.  I also recommend a coronary calcium score to better assess his cardiovascular risk.  Plan follow-up with me in about 3 to 4 months.  Thanks again for the kind referral.  Seth Casino, MD, FACC, [place] Director of the [facility]  [facility] Diplomate of the American Board of Clinical Lipidology Attending Cardiologist  Direct Dial:  [phone]  Fax: [phone]  Website:  [email or web address] 06/02/2021, 9:53 PM

## 2021-06-02 NOTE — Patient Instructions (Signed)
Medication Instructions:  START fenofibrate 48mg  daily START vascepa 2 capsules twice daily  *If you need a refill on your cardiac medications before your next appointment, please call your pharmacy*   Lab Work: FASTING lab work in 3-4 months to check cholesterol  -- complete about 1 week before your next visit -- lipid panel, direct LDL, hepatic function panel   If you have labs (blood work) drawn today and your tests are completely normal, you will receive your results only by: [place] (if you have MyChart) OR A paper copy in the mail If you have any lab test that is abnormal or we need to change your treatment, we will call you to review the results.   Testing/Procedures: Dr. Debara Pickett has ordered a CT coronary calcium score. This test is done at [facility]. This is $99 out of pocket.   Coronary CalciumScan A coronary calcium scan is an imaging test used to look for deposits of calcium and other fatty materials (plaques) in the inner lining of the blood vessels of the heart (coronary arteries). These deposits of calcium and plaques can partly clog and narrow the coronary arteries without producing any symptoms or warning signs. This puts a person at risk for a heart attack. This test can detect these deposits before symptoms develop. Tell a health care provider about: Any allergies you have. All medicines you are taking, including vitamins, herbs, eye drops, creams, and over-the-counter medicines. Any problems you or family members have had with anesthetic medicines. Any blood disorders you have. Any surgeries you have had. Any medical conditions you have. Whether you are pregnant or may be pregnant. What are the risks? Generally, this is a safe procedure. However, problems may occur, including: Harm to a pregnant woman and her unborn baby. This test involves the use of radiation. Radiation exposure can be dangerous to a pregnant woman and her unborn baby. If you  are pregnant, you generally should not have this procedure done. Slight increase in the risk of cancer. This is because of the radiation involved in the test. What happens before the procedure? No preparation is needed for this procedure. What happens during the procedure? You will undress and remove any jewelry around your neck or chest. You will put on a hospital gown. Sticky electrodes will be placed on your chest. The electrodes will be connected to an electrocardiogram (ECG) machine to record a tracing of the electrical activity of your heart. A CT scanner will take pictures of your heart. During this time, you will be asked to lie still and hold your breath for 2-3 seconds while a picture of your heart is being taken. The procedure may vary among health care providers and hospitals. What happens after the procedure? You can get dressed. You can return to your normal activities. It is up to you to get the results of your test. Ask your health care provider, or the department that is doing the test, when your results will be ready. Summary A coronary calcium scan is an imaging test used to look for deposits of calcium and other fatty materials (plaques) in the inner lining of the blood vessels of the heart (coronary arteries). Generally, this is a safe procedure. Tell your health care provider if you are pregnant or may be pregnant. No preparation is needed for this procedure. A CT scanner will take pictures of your heart. You can return to your normal activities after the scan is done. This information is not intended to  replace advice given to you by your health care provider. Make sure you discuss any questions you have with your health care provider. Document Released: 12/17/2007 Document Revised: 05/09/2016 Document Reviewed: 05/09/2016 Elsevier Interactive Patient Education  2017 [place]: At [facility], you and your health needs are our priority.  As part  of our continuing mission to provide you with exceptional heart care, we have created designated Provider Care Teams.  These Care Teams include your primary Cardiologist (physician) and Advanced Practice Providers (APPs -  Physician Assistants and Nurse Practitioners) who all work together to provide you with the care you need, when you need it.  We recommend signing up for the patient portal called "MyChart".  Sign up information is provided on this After Visit Summary.  MyChart is used to connect with patients for Virtual Visits (Telemedicine).  Patients are able to view lab/test results, encounter notes, upcoming appointments, etc.  Non-urgent messages can be sent to your provider as well.   To learn more about what you can do with MyChart, go to [email or web address].    Your next appointment:   4 month(s)  The format for your next appointment:   In Person  Provider:   K. Mali Hilty, MD{

## 2021-06-04 ENCOUNTER — Telehealth: Payer: Self-pay

## 2021-06-04 NOTE — Telephone Encounter (Signed)
**Note De-Identified [place] Obfuscation** Late entry: Yesterday, I attempted to do the pts Icosapent PA through covermymeds but received this message: [employer] does not review prior authorization for this plan. Please refer to the phone number on the back of the prescription ID card.  I called the number on the back of the pts ins card and was able to do the Icosapent PA over the phone with Lorriane Shire. Per Lorriane Shire she has sent this PA to their pharmacist review dept and that most likely they will require documentation to prove the pts need for Icosapent and will reach out to [place] Xylan Sheils fax requesting the documents. PA Case#: PA [id number]

## 2021-06-16 NOTE — Telephone Encounter (Signed)
Called insurance at [phone] to check on status  PA is denied -- appeal will need to be submitted  Reason for denial:  No medical records submitted -- no request received -- amongst a lengthy paragraph per representative  This will be faxed for appeal to be submitted

## 2021-06-16 NOTE — Telephone Encounter (Signed)
Denial letter received  Will have MD review

## 2021-06-23 NOTE — Telephone Encounter (Signed)
Appeals letter composed. Faxed with MD note, original denial letter to [facility] appeals at [phone]

## 2021-07-07 ENCOUNTER — Telehealth: Payer: Self-pay | Admitting: Internal Medicine

## 2021-07-07 NOTE — Telephone Encounter (Signed)
°  Pt c/o medication issue:  1. Name of Medication: icosapent Ethyl (VASCEPA) 1 g capsule  2. How are you currently taking this medication (dosage and times per day)? Take 2 capsules (2 g total) by mouth 2 (two) times daily.  3. Are you having a reaction (difficulty breathing--STAT)?   4. What is your medication issue? Pt is following up, he said [facility] sent prior auth form to Dr. Debara Pickett to fill it up so meds can be covered by his insurance

## 2021-07-07 NOTE — Telephone Encounter (Signed)
Called [employer] to check on status of appeal. Rep that I spoke with was unable to assist with update on this medication's approval/denial and instead suggested that I call appeals department at [phone]

## 2021-07-08 MED ORDER — FENOFIBRATE 48 MG PO TABS: 48.0000 mg | ORAL_TABLET | Freq: Every day | ORAL | 0 refills | Status: DC

## 2021-07-08 NOTE — Telephone Encounter (Signed)
Called patient with update on Vascepa (had sent MyChart message, not reviewed) Explained initial PA denied Appeal has been submitted but no outcome yet  He also states he has not gotten fenofibrate 48mg  from [employer] - advised this was sent in on 11/30 Refilled to local pharmacy per request He will call [facility] to check on this  Advised will send MyChart message w/update on Vascepa

## 2021-07-09 NOTE — Telephone Encounter (Signed)
Patient updated with appeal denial for vascepa  Will send note to MD to review - would lovaza be an option?  Patient has been paying $100/month cash price for vascepa and will do so until his 10/04/21 visit and labs to see how his lipids have responded but will discuss plan further with MD at that time

## 2021-07-09 NOTE — Telephone Encounter (Signed)
Appeals case denied - spoke with [facility] rep at phone: [phone] Coverage eligible if over age 40, with ASCVD, or dx DM with 2+ risk factors -- calcium score scheduled for 07/23/21  Asked rep if age is a determining factor in approval, which it is. He is 41 years old.

## 2021-07-23 ENCOUNTER — Other Ambulatory Visit: Payer: Self-pay

## 2021-07-23 ENCOUNTER — Ambulatory Visit (INDEPENDENT_AMBULATORY_CARE_PROVIDER_SITE_OTHER)
Admission: RE | Admit: 2021-07-23 | Discharge: 2021-07-23 | Disposition: A | Discharge status: Home / Self Care | Payer: Self-pay | Source: Ambulatory Visit | Attending: Internal Medicine | Admitting: Internal Medicine

## 2021-07-23 DIAGNOSIS — E781 Pure hyperglyceridemia: Secondary | ICD-10-CM

## 2021-07-23 NOTE — Telephone Encounter (Signed)
Pixie Casino, MD  Fidel Levy, RN Caller: Unspecified (2 weeks ago) Would continue with Vascepa - if no coronary calcium, we could consider Lovaza - but there is much less data for benefit.   Dr Lemmie Evens     Calcium score test 07/23/20 -- will f/u with patient after

## 2021-09-23 LAB — HEPATIC FUNCTION PANEL
ALT: 47 IU/L — ABNORMAL HIGH (ref 0–44)
AST: 33 IU/L (ref 0–40)
Albumin: 4.9 g/dL (ref 4.0–5.0)
Alkaline Phosphatase: 71 IU/L (ref 44–121)
Bilirubin Total: 0.3 mg/dL (ref 0.0–1.2)
Bilirubin, Direct: <0.1 mg/dL (ref 0.00–0.40)
Total Protein: 7.5 g/dL (ref 6.0–8.5)

## 2021-09-23 LAB — LIPID PANEL
Chol/HDL Ratio: 5.6 ratio — ABNORMAL HIGH (ref 0.0–5.0)
Cholesterol, Total: 257 mg/dL — ABNORMAL HIGH (ref 100–199)
HDL: 46 mg/dL (ref >39)
LDL Chol Calc (NIH): 170 mg/dL — ABNORMAL HIGH (ref 0–99)
Triglycerides: 219 mg/dL — ABNORMAL HIGH (ref 0–149)
VLDL Cholesterol Cal: 41 mg/dL — ABNORMAL HIGH (ref 5–40)

## 2021-09-23 LAB — LDL CHOLESTEROL, DIRECT: LDL Direct: 177 mg/dL — ABNORMAL HIGH (ref 0–99)

## 2021-10-04 ENCOUNTER — Encounter: Payer: Self-pay | Admitting: Internal Medicine

## 2021-10-04 ENCOUNTER — Ambulatory Visit: Payer: 59 | Admitting: Internal Medicine

## 2021-10-04 VITALS — BP 105/68 | HR 86 | Ht 74.0 in | Wt 237.2 lb

## 2021-10-04 DIAGNOSIS — K760 Fatty (change of) liver, not elsewhere classified: Secondary | ICD-10-CM

## 2021-10-04 DIAGNOSIS — E781 Pure hyperglyceridemia: Secondary | ICD-10-CM

## 2021-10-04 MED ORDER — ATORVASTATIN CALCIUM 40 MG PO TABS: 40.0000 mg | ORAL_TABLET | Freq: Every day | ORAL | 5 refills | Status: DC

## 2021-10-04 MED ORDER — FENOFIBRATE 48 MG PO TABS: 48.0000 mg | ORAL_TABLET | Freq: Every day | ORAL | 3 refills | Status: DC

## 2021-10-04 NOTE — Addendum Note (Signed)
Addended by: Fidel Levy on: 10/04/2021 03:53 PM ? ? Modules accepted: Orders ? ?

## 2021-10-04 NOTE — Patient Instructions (Signed)
Medication Instructions:  ?START atorvastatin '40mg'$  daily at nighttime ? ?*If you need a refill on your cardiac medications before your next appointment, please call your pharmacy* ? ? ?Lab Work: ?Non-Fasting liver function test after 2 weeks on the statin  ? ?Fasting lipid panel in 3-4 months -- before next appointment with Dr.Hilty  ? ?If you have labs (blood work) drawn today and your tests are completely normal, you will receive your results only by: ?MyChart Message (if you have MyChart) OR ?A paper copy in the mail ?If you have any lab test that is abnormal or we need to change your treatment, we will call you to review the results. ? ? ?Testing/Procedures: ?NONE ? ? ?Follow-Up: ?At [facility], you and your health needs are our priority.  As part of our continuing mission to provide you with exceptional heart care, we have created designated Provider Care Teams.  These Care Teams include your primary Cardiologist (physician) and Advanced Practice Providers (APPs -  Physician Assistants and Nurse Practitioners) who all work together to provide you with the care you need, when you need it. ? ?We recommend signing up for the patient portal called "MyChart".  Sign up information is provided on this After Visit Summary.  MyChart is used to connect with patients for Virtual Visits (Telemedicine).  Patients are able to view lab/test results, encounter notes, upcoming appointments, etc.  Non-urgent messages can be sent to your provider as well.   ?To learn more about what you can do with MyChart, go to [email or web address].   ? ?Your next appointment:   ?3-4 months with Dr. Debara Pickett ? ? ?Other Instructions ? ? ?

## 2021-10-04 NOTE — Progress Notes (Signed)
? ? ? ?LIPID CLINIC CONSULT NOTE ? ?Chief Complaint:  ?Manage dyslipidemia ? ?Primary Care Physician: ?Seth Neer, MD ? ?Primary Cardiologist:  ?None ? ?HPI:  ?Seth [name] is a 41 y.o. male who is being seen today for the evaluation of dyslipidemia at the request of Seth Neer, MD. this is a pleasant 41 year old male with a history of dyslipidemia and particular high triglycerides and his mother and maternal grandfather.  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  He has taken this, fish oil and other treatments in the past but recently discontinued them as he felt that they were ineffective.  He also reported itching on rosuvastatin.  [name] has no known coronary disease.  He does have a history of elevated liver enzymes and ultimately had a liver biopsy which showed no fibrosis.  Has been followed by Seth [name], Seth [name] with hepatology in [place]. ? ?10/04/2021 ? ?Seth [name] returns today for follow-up of dyslipidemia.  He reports has been taking both fenofibrate and Vascepa however insurance would not cover the Vascepa and has been paying out-of-pocket.  Despite this we have seen improvement in his lipids.  Triglycerides have come down to 219 from over 500.  Total cholesterol however has gone up and now 257, HDL 46 and LDL 170.  He has been off of statin therapy.  As previously mentioned, he does have biopsy-proven fatty liver disease without any fibrosis.  He has had some elevated liver enzymes in the past which are expected to worsen somewhat on statin therapy however it is recommended that we trial him on statin therapy to lower his cholesterol.  Fortunately, he was not found to have any coronary calcification on CT however given his young age he may not have had opportunity to calcified his coronaries at this point. ? ?PMHx:  ?Past Medical History:  ?Diagnosis Date  ? ADHD  (attention deficit hyperactivity disorder)   ? Ankle fracture, right 12/05/2015  ? Anxiety   ? Depression   ? Hyperlipemia   ? Hypertension   ? Insomnia   ? ? ?Past Surgical History:  ?Procedure Laterality Date  ? ORIF ANKLE FRACTURE Right 12/16/2015  ? Procedure: OPEN REDUCTION INTERNAL FIXATION (ORIF) RIGHT ANKLE FRACTURE;  Surgeon: Seth Koyanagi, MD;  Location: [place];  Service: Orthopedics;  Laterality: Right;  ? ORIF CLAVICLE FRACTURE Right   ? ? ?FAMHx:  ?Family History  ?Problem Relation Age of Onset  ? ADD / ADHD Mother   ? Anxiety disorder Mother   ? Depression Mother   ? Anxiety disorder Father   ? Depression Father   ? Bipolar disorder Sister   ? Drug abuse Brother   ? Drug abuse Brother   ? ? ?SOCHx:  ? reports that he quit smoking about 19 years ago. His smoking use included cigarettes. He has never used smokeless tobacco. He reports current alcohol use of about 8.0 standard drinks per week. He reports that he does not use drugs. ? ?ALLERGIES:  ?Allergies  ?Allergen Reactions  ? Rosuvastatin Itching  ? Tramadol Itching  ? ? ?ROS: ?Pertinent items noted in HPI and remainder of comprehensive ROS otherwise negative. ? ?HOME MEDS: ?Current Outpatient Medications on File Prior to Visit  ?Medication Sig Dispense Refill  ? amLODipine (NORVASC) 10 MG tablet Take 10 mg by mouth daily.    ? desvenlafaxine (PRISTIQ) 100 MG 24 hr  tablet Take 1 tablet by mouth daily.    ? diphenhydrAMINE-zinc acetate (BENADRYL) cream Apply 1 application topically 3 (three) times daily as needed for itching.    ? fenofibrate (TRICOR) 48 MG tablet Take 1 tablet (48 mg total) by mouth daily. 30 tablet 0  ? hydrochlorothiazide (HYDRODIURIL) 25 MG tablet Take 25 mg by mouth daily.    ? ibuprofen (ADVIL) 200 MG tablet Take 800 mg by mouth every 6 (six) hours as needed for headache or moderate pain.    ? icosapent Ethyl (VASCEPA) 1 g capsule Take 2 capsules (2 g total) by mouth 2 (two) times daily. 360 capsule 3  ?  lisinopril (ZESTRIL) 40 MG tablet Take 40 mg by mouth daily.    ? ALPRAZolam (XANAX) 0.25 MG tablet Take 0.25 mg by mouth 2 (two) times daily. As needed (Patient not taking: Reported on 10/04/2021)    ? hydrOXYzine (VISTARIL) 25 MG capsule Take 1 capsule (25 mg total) by mouth 2 (two) times daily as needed for anxiety. As well as insomnia at bedtime. 60 capsule 0  ? traZODone (DESYREL) 50 MG tablet Take 1-2 tablets (50-100 mg total) by mouth at bedtime as needed for sleep. (Patient taking differently: Take 50 mg by mouth at bedtime as needed for sleep.) 60 tablet 0  ? ?No current facility-administered medications on file prior to visit.  ? ? ?LABS/IMAGING: ?No results found for this or any previous visit (from the past 48 hour(s)). ?No results found. ? ?LIPID PANEL: ?   ?Component Value Date/Time  ? CHOL 257 (H) 09/22/2021 1320  ? TRIG 219 (H) 09/22/2021 1320  ? HDL 46 09/22/2021 1320  ? CHOLHDL 5.6 (H) 09/22/2021 1320  ? LDLCALC 170 (H) 09/22/2021 1320  ? LDLDIRECT 177 (H) 09/22/2021 1320  ? ? ?WEIGHTS: ?Wt Readings from Last 3 Encounters:  ?10/04/21 237 lb 3.2 oz (107.6 kg)  ?06/02/21 234 lb (106.1 kg)  ?04/25/20 225 lb (102.1 kg)  ? ? ?VITALS: ?BP 105/68   Pulse 86   Ht '6\' 2"'$  (1.88 m)   Wt 237 lb 3.2 oz (107.6 kg)   SpO2 97%   BMI 30.45 kg/m?  ? ?EXAM: ?Deferred ? ?EKG: ?Deferred ? ?ASSESSMENT: ?Mixed dyslipidemia with high triglycerides ?Family history of premature coronary disease ?Statin and fenofibrate intolerance with elevated liver enzymes ?NAFLD-biopsy-proven ?0 CAC score (07/2021) ? ?PLAN: ?1.   Seth [name] has had a significant reduction in his triglycerides on the combination of Vascepa and fenofibrate.  LDL remains quite elevated.  He had no coronary calcium.  I would target his LDL to less than 100 and recommend we try atorvastatin 40 mg daily.  This will very likely be associated with an increase in liver enzymes which will be monitored closely.  We will go ahead and get repeat liver enzymes in  about 2 to 3 weeks.  If they are stable or minimally elevated to less than 3 times upper limit of normal, will continue with therapy and plan to repeat lipids in about 3 to 4 months and follow-up at that time. ? ?Pixie Casino, MD, [facility], FACP  ?[place]  ?Medical Director of the Advanced Lipid Disorders &  ?Cardiovascular Risk Reduction Clinic ?Diplomate of the [employer] of Clinical Lipidology ?Attending Cardiologist  ?Direct Dial: [phone]  Fax: [phone]  ?Website:  [email or web address] ? ?Nadean Corwin Roben Tatsch ?10/04/2021, 11:22 AM  ? ?

## 2021-11-09 LAB — HEPATIC FUNCTION PANEL
ALT: 51 IU/L — ABNORMAL HIGH (ref 0–44)
AST: 30 IU/L (ref 0–40)
Albumin: 4.9 g/dL (ref 4.0–5.0)
Alkaline Phosphatase: 75 IU/L (ref 44–121)
Bilirubin Total: 0.5 mg/dL (ref 0.0–1.2)
Bilirubin, Direct: 0.14 mg/dL (ref 0.00–0.40)
Total Protein: 7.5 g/dL (ref 6.0–8.5)

## 2021-11-30 ENCOUNTER — Encounter: Payer: Self-pay | Admitting: Internal Medicine

## 2021-12-29 ENCOUNTER — Emergency Department: Payer: 59

## 2021-12-29 ENCOUNTER — Encounter: Payer: Self-pay | Admitting: Emergency Medicine

## 2021-12-29 ENCOUNTER — Emergency Department
Admission: EM | Admit: 2021-12-29 | Discharge: 2021-12-29 | Disposition: A | Discharge status: Home / Self Care | Payer: 59 | Attending: Emergency Medicine | Admitting: Emergency Medicine

## 2021-12-29 ENCOUNTER — Other Ambulatory Visit: Payer: Self-pay

## 2021-12-29 DIAGNOSIS — R7989 Other specified abnormal findings of blood chemistry: Secondary | ICD-10-CM | POA: Diagnosis not present

## 2021-12-29 DIAGNOSIS — I10 Essential (primary) hypertension: Secondary | ICD-10-CM | POA: Insufficient documentation

## 2021-12-29 DIAGNOSIS — R7309 Other abnormal glucose: Secondary | ICD-10-CM | POA: Insufficient documentation

## 2021-12-29 DIAGNOSIS — R197 Diarrhea, unspecified: Secondary | ICD-10-CM | POA: Insufficient documentation

## 2021-12-29 DIAGNOSIS — Z20822 Contact with and (suspected) exposure to covid-19: Secondary | ICD-10-CM | POA: Diagnosis not present

## 2021-12-29 DIAGNOSIS — R072 Precordial pain: Secondary | ICD-10-CM | POA: Insufficient documentation

## 2021-12-29 DIAGNOSIS — R000 Tachycardia, unspecified: Secondary | ICD-10-CM | POA: Insufficient documentation

## 2021-12-29 DIAGNOSIS — R112 Nausea with vomiting, unspecified: Secondary | ICD-10-CM | POA: Diagnosis not present

## 2021-12-29 DIAGNOSIS — R1030 Lower abdominal pain, unspecified: Secondary | ICD-10-CM | POA: Insufficient documentation

## 2021-12-29 DIAGNOSIS — R079 Chest pain, unspecified: Secondary | ICD-10-CM

## 2021-12-29 LAB — CBC
HCT: 38.8 % — ABNORMAL LOW (ref 39.0–52.0)
Hemoglobin: 13.6 g/dL (ref 13.0–17.0)
MCH: 30.8 pg (ref 26.0–34.0)
MCHC: 35.1 g/dL (ref 30.0–36.0)
MCV: 88 fL (ref 80.0–100.0)
Platelets: 211 10*3/uL (ref 150–400)
RBC: 4.41 MIL/uL (ref 4.22–5.81)
RDW: 11.3 % — ABNORMAL LOW (ref 11.5–15.5)
WBC: 9.3 10*3/uL (ref 4.0–10.5)
nRBC: 0 % (ref 0.0–0.2)

## 2021-12-29 LAB — LACTIC ACID, PLASMA: Lactic Acid, Venous: 1.6 mmol/L (ref 0.5–1.9)

## 2021-12-29 LAB — BASIC METABOLIC PANEL
Anion gap: 11 (ref 5–15)
BUN: 21 mg/dL — ABNORMAL HIGH (ref 6–20)
CO2: 25 mmol/L (ref 22–32)
Calcium: 9.8 mg/dL (ref 8.9–10.3)
Chloride: 103 mmol/L (ref 98–111)
Creatinine, Ser: 0.78 mg/dL (ref 0.61–1.24)
GFR, Estimated: >60 mL/min (ref >60)
Glucose, Bld: 157 mg/dL — ABNORMAL HIGH (ref 70–99)
Potassium: 3.3 mmol/L — ABNORMAL LOW (ref 3.5–5.1)
Sodium: 139 mmol/L (ref 135–145)

## 2021-12-29 LAB — URINALYSIS, ROUTINE W REFLEX MICROSCOPIC
Bilirubin Urine: NEGATIVE
Glucose, UA: NEGATIVE mg/dL
Hgb urine dipstick: NEGATIVE
Ketones, ur: NEGATIVE mg/dL
Leukocytes,Ua: NEGATIVE
Nitrite: NEGATIVE
Protein, ur: NEGATIVE mg/dL
Specific Gravity, Urine: 1.027 (ref 1.005–1.030)
pH: 5 (ref 5.0–8.0)

## 2021-12-29 LAB — SARS CORONAVIRUS 2 BY RT PCR: SARS Coronavirus 2 by RT PCR: NEGATIVE

## 2021-12-29 LAB — TROPONIN I (HIGH SENSITIVITY)
Troponin I (High Sensitivity): <2 ng/L (ref <18)
Troponin I (High Sensitivity): 3 ng/L (ref <18)

## 2021-12-29 LAB — LIPASE, BLOOD: Lipase: 57 U/L — ABNORMAL HIGH (ref 11–51)

## 2021-12-29 MED ORDER — FENTANYL CITRATE PF 50 MCG/ML IJ SOSY
50.0000 ug | PREFILLED_SYRINGE | Freq: Once | INTRAMUSCULAR | Status: AC
  Administered 2021-12-29: 50 ug via INTRAVENOUS
  Filled 2021-12-29: qty 1

## 2021-12-29 MED ORDER — ONDANSETRON 4 MG PO TBDP: 4.0000 mg | ORAL_TABLET | Freq: Three times a day (TID) | ORAL | 0 refills | Status: AC | PRN

## 2021-12-29 MED ORDER — IOHEXOL 300 MG/ML  SOLN
100.0000 mL | Freq: Once | INTRAMUSCULAR | Status: AC | PRN
  Administered 2021-12-29: 100 mL via INTRAVENOUS

## 2021-12-29 MED ORDER — DOXYCYCLINE HYCLATE 100 MG PO CAPS: 100.0000 mg | ORAL_CAPSULE | Freq: Two times a day (BID) | ORAL | 0 refills | Status: DC

## 2021-12-29 MED ORDER — LACTATED RINGERS IV BOLUS
1000.0000 mL | Freq: Once | INTRAVENOUS | Status: AC
  Administered 2021-12-29: 1000 mL via INTRAVENOUS

## 2021-12-29 MED ORDER — ONDANSETRON HCL 4 MG/2ML IJ SOLN
4.0000 mg | Freq: Once | INTRAMUSCULAR | Status: AC
  Administered 2021-12-29: 4 mg via INTRAVENOUS
  Filled 2021-12-29: qty 2

## 2021-12-29 NOTE — ED Triage Notes (Addendum)
Pt here from [facility] with CP that started Monday. Pt states pain is in the center of his chest to his left shoulder. Pt states the pain turned into muscle aches last night. Pt also having fatigue, abd pain, fever, and chills. Pt had diarrhea this morning. Pt also has several red bumps all over him from working out in the woods recently.

## 2021-12-29 NOTE — Discharge Instructions (Signed)
Please follow-up with your primary care provider if you are not improving over the next 24 to 48 hours. I recommend having repeat lab studies in about a week to include lipase. Return to the emergency department if any symptom changes or worsens. Avoid products with Tylenol/acetaminophen.  Do not consume alcohol until you are cleared by your primary care provider. While you are taking doxycycline, please also take a probiotic.  This could be purchased at the pharmacy where you pick up your prescriptions.

## 2021-12-29 NOTE — ED Provider Notes (Signed)
[facility] Provider Note    Event Date/Time   First MD Initiated Contact with Patient 12/29/21 1412     (approximate)   History   Chest Pain   HPI  Seth [name] is a 41 y.o. male presents to the emergency department for treatment and evaluation of chest and abdominal pain.  Chest pain started on Monday as midsternal and nonradiating.  Since Monday pain has become more like muscle soreness diffuse over the chest wall.  Does not recall any specific injury.  No cardiac history.  He does have a history of hyperlipidemia and hypertension.  Abdominal pain started yesterday evening.  Pain is diffuse over the lower abdomen.  He has had 2 episodes of vomiting today with some diarrhea as well.  All the above symptoms seem to begin after he was bitten by something when in the woods on Monday.  He has an erythematous area to the right lower extremity at the medial aspect of the knee.  Wife reports that he had not temperature last night of 101.  She gave him Tylenol which broke the fever.  He had initially presented to [facility] for evaluation however was sent to the emergency department.  Past Medical History:  Diagnosis Date   ADHD (attention deficit hyperactivity disorder)    Ankle fracture, right 12/05/2015   Anxiety    Depression    Hyperlipemia    Hypertension    Insomnia      Physical Exam   Triage Vital Signs: ED Triage Vitals  Enc Vitals Group     BP 12/29/21 1240 (!) 125/99     Pulse Rate 12/29/21 1240 (!) 107     Resp 12/29/21 1240 18     Temp 12/29/21 1240 99.1 F (37.3 C)     Temp Source 12/29/21 1240 Oral     SpO2 12/29/21 1240 94 %     Weight 12/29/21 1237 237 lb 3.4 oz (107.6 kg)     Height 12/29/21 1237 '6\' 2"'$  (1.88 m)     Head Circumference --      Peak Flow --      Pain Score 12/29/21 1236 2     Pain Loc --      Pain Edu? --      Excl. in [place]? --     Most recent vital signs: Vitals:   12/29/21 1240  BP: (!) 125/99  Pulse:  (!) 107  Resp: 18  Temp: 99.1 F (37.3 C)  SpO2: 94%    General: Awake, no distress.  CV:  Good peripheral perfusion.  Resp:  Normal effort.  Abd:  No distention.  No focal abdominal tenderness. Other:     ED Results / Procedures / Treatments   Labs (all labs ordered are listed, but only abnormal results are displayed) Labs Reviewed  BASIC METABOLIC PANEL - Abnormal; Notable for the following components:      Result Value   Potassium 3.3 (*)    Glucose, Bld 157 (*)    BUN 21 (*)    All other components within normal limits  CBC - Abnormal; Notable for the following components:   HCT 38.8 (*)    RDW 11.3 (*)    All other components within normal limits  URINALYSIS, ROUTINE W REFLEX MICROSCOPIC - Abnormal; Notable for the following components:   Color, Urine YELLOW (*)    APPearance CLEAR (*)    All other components within normal limits  LIPASE, BLOOD - Abnormal;  Notable for the following components:   Lipase 57 (*)    All other components within normal limits  SARS CORONAVIRUS 2 BY RT PCR  LACTIC ACID, PLASMA  LYME DISEASE SEROLOGY W/REFLEX  LACTIC ACID, PLASMA  ROCKY MTN SPOTTED FVR ABS PNL(IGG+IGM)  MONONUCLEOSIS SCREEN  TROPONIN I (HIGH SENSITIVITY)  TROPONIN I (HIGH SENSITIVITY)     EKG  Sinus tachycardia with rate of 103 and nonspecific T wave abnormality.  No indication of STEMI.   RADIOLOGY  Chest x-ray negative for acute concerns.  CT abdomen and pelvis shows potential early colitis but otherwise negative for acute findings.  I have independently reviewed and interpreted imaging as well as reviewed report from radiology.  PROCEDURES:  Critical Care performed: No  Procedures   MEDICATIONS ORDERED IN ED:  Medications  lactated ringers bolus 1,000 mL (0 mLs Intravenous Stopped 12/29/21 1706)  ondansetron (ZOFRAN) injection 4 mg (4 mg Intravenous Given 12/29/21 1458)  fentaNYL (SUBLIMAZE) injection 50 mcg (50 mcg Intravenous Given 12/29/21 1502)   iohexol (OMNIPAQUE) 300 MG/ML solution 100 mL (100 mLs Intravenous Contrast Given 12/29/21 1508)     IMPRESSION / MDM / ASSESSMENT AND PLAN / ED COURSE   I reviewed the triage vital signs and the nursing notes.  Differential diagnosis includes, but is not limited to: COVID, influenza, viral syndrome, gastroenteritis, tickborne illness, cardiac event  Patient's presentation is most consistent with acute presentation with potential threat to life or bodily function.  41 year old male presenting to the emergency department for treatment and evaluation of symptoms as described in the HPI.  While awaiting ER room assignment, protocol labs were drawn.  Initial troponin is normal.  CBC does not indicate a leukocytosis.  Glucose slightly elevated at 157, however this is a nonfasting lab.  Mild increase in BUN but a normal creatinine of 0.78.  GFR remains greater than 60.  COVID test negative.  Plan will be to add a lipase as well as screening for tickborne illness.  Lipase is just slightly elevated at 57.  Patient has no focal left upper quadrant tenderness.  CT scan shows some evidence of an early colitis but otherwise negative for acute concerns.  Results were discussed with the patient.  Patient states that he had an episode of mono a few years ago that cause abdominal pain, headache, and some similar symptoms as to what he is experiencing now.  Plan will be to draw a [place]ospot prior to discharge and he will follow up on the results via [place].  He will also review results from tick borne illness panels.  Empiric treatment with doxycycline for potential Lyme disease be started today.  He was strongly encouraged to also take a probiotic since he has had some diarrhea.  For fever, he was encouraged to take ibuprofen instead of Tylenol since his lipase is slightly elevated.  He is to see primary care in 2 to 3 days if not improving or in about a week for repeat labs including lipase.  He was encouraged to  avoid alcohol as well as the Tylenol.  If any of his symptoms change or worsen and he is unable to see primary care, he was strongly encouraged to return to the emergency department.      FINAL CLINICAL IMPRESSION(S) / ED DIAGNOSES   Final diagnoses:  Nonspecific chest pain  Lower abdominal pain  Nausea vomiting and diarrhea     Rx / DC Orders   ED Discharge Orders  Ordered    doxycycline (VIBRAMYCIN) 100 MG capsule  2 times daily        12/29/21 1634    ondansetron (ZOFRAN-ODT) 4 MG disintegrating tablet  Every 8 hours PRN        12/29/21 1634             Note:  This document was prepared using Dragon voice recognition software and may include unintentional dictation errors.   Victorino Dike, FNP 12/29/21 1718    Duffy Bruce, MD 01/01/22 1115

## 2021-12-29 NOTE — ED Notes (Signed)
See triage note.  Presents with some chest discomfort for several days  describes pain "muscle soreness"  Low grade temp on arrival

## 2021-12-30 LAB — LYME DISEASE SEROLOGY W/REFLEX: Lyme Total Antibody EIA: NEGATIVE

## 2022-01-03 LAB — ROCKY MTN SPOTTED FVR ABS PNL(IGG+IGM)
RMSF IgG: POSITIVE — AB
RMSF IgM: 0.28 index (ref 0.00–0.89)

## 2022-01-03 LAB — RMSF, IGG, IFA: RMSF, IGG, IFA: <1:64 {titer}

## 2022-01-05 ENCOUNTER — Telehealth: Payer: Self-pay | Admitting: Emergency Medicine

## 2022-01-05 NOTE — Telephone Encounter (Signed)
Called patient to ask if he has questions about his test results.  Did not leave message as the name announced on the phone was not the patients.

## 2022-01-24 ENCOUNTER — Ambulatory Visit: Payer: 59 | Admitting: Internal Medicine

## 2022-03-11 LAB — LIPID PANEL
Chol/HDL Ratio: 5.4 ratio — ABNORMAL HIGH (ref 0.0–5.0)
Cholesterol, Total: 206 mg/dL — ABNORMAL HIGH (ref 100–199)
HDL: 38 mg/dL — ABNORMAL LOW (ref >39)
LDL Chol Calc (NIH): 124 mg/dL — ABNORMAL HIGH (ref 0–99)
Triglycerides: 247 mg/dL — ABNORMAL HIGH (ref 0–149)
VLDL Cholesterol Cal: 44 mg/dL — ABNORMAL HIGH (ref 5–40)

## 2022-03-15 ENCOUNTER — Ambulatory Visit (HOSPITAL_BASED_OUTPATIENT_CLINIC_OR_DEPARTMENT_OTHER): Payer: 59 | Admitting: Internal Medicine

## 2022-04-01 ENCOUNTER — Other Ambulatory Visit: Payer: Self-pay | Admitting: *Deleted

## 2022-04-01 DIAGNOSIS — E781 Pure hyperglyceridemia: Secondary | ICD-10-CM

## 2022-04-01 DIAGNOSIS — K760 Fatty (change of) liver, not elsewhere classified: Secondary | ICD-10-CM

## 2022-04-19 LAB — HEPATIC FUNCTION PANEL
ALT: 53 IU/L — ABNORMAL HIGH (ref 0–44)
AST: 33 IU/L (ref 0–40)
Albumin: 5.3 g/dL — ABNORMAL HIGH (ref 4.1–5.1)
Alkaline Phosphatase: 89 IU/L (ref 44–121)
Bilirubin Total: 0.6 mg/dL (ref 0.0–1.2)
Bilirubin, Direct: 0.18 mg/dL (ref 0.00–0.40)
Total Protein: 7.9 g/dL (ref 6.0–8.5)

## 2022-04-26 MED ORDER — EZETIMIBE 10 MG PO TABS
10.0000 mg | ORAL_TABLET | Freq: Every day | ORAL | 3 refills | Status: DC
Start: 2022-04-26 — End: 2022-07-21

## 2022-04-26 NOTE — Addendum Note (Signed)
Addended by: Fidel Levy on: 04/26/2022 04:30 PM   Modules accepted: Orders

## 2022-05-17 ENCOUNTER — Other Ambulatory Visit: Payer: Self-pay | Admitting: Internal Medicine

## 2022-05-17 MED ORDER — ICOSAPENT ETHYL 1 G PO CAPS: 2.0000 g | ORAL_CAPSULE | Freq: Two times a day (BID) | ORAL | 2 refills | Status: DC

## 2022-06-11 IMAGING — CT CT CARDIAC CORONARY ARTERY CALCIUM SCORE
3 series · 14 of 20 positions shown, 16 images · non-contrast
Comparison: 06/01/2014 chest radiograph
COMPARISON: 06/01/2014 chest radiograph

Addendum:
EXAM:
OVER-READ INTERPRETATION  CT CHEST

The following report is an over-read performed by radiologist Dr.
Alaaeldeen Mahamad [REDACTED] on 07/23/2021. This over-read
does not include interpretation of cardiac or coronary anatomy or
pathology. The calcium score interpretation by the cardiologist is
attached.
CLINICAL DATA: Cardiovascular Disease Risk stratification
Coronary Calcium Score
TECHNIQUE: A gated, non-contrast computed tomography scan of the heart was
performed using 3mm slice thickness. Axial images were analyzed on a
dedicated workstation. Calcium scoring of the coronary arteries was
performed using the Agatston method.

[Series 2: cascseq 2.0 sa36 70% (id) · axial · 0.43mm/px · z∈[-209,-107]mm · 4 of 85 slices shown]
[im 17/85  vessel]
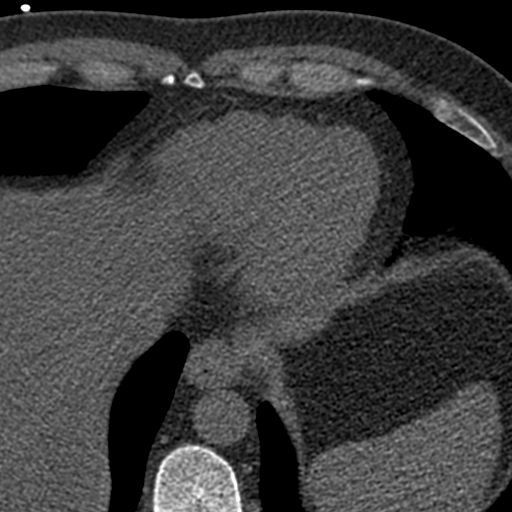
[im 34/85  vessel]
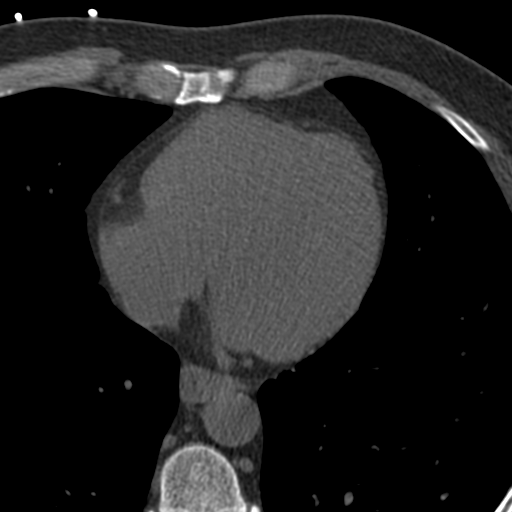
[im 51/85  vessel]
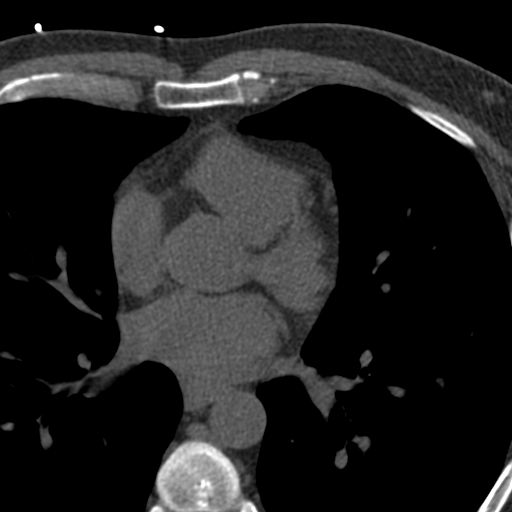
[im 68/85  vessel]
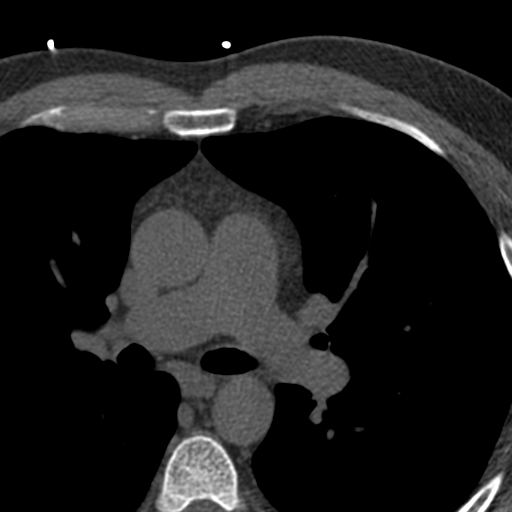

[Series 3: cascseq 2.0 bf37 st · axial · 0.79mm/px · z∈[-213,-101]mm · 5 of 85 slices shown, 7 images]
[im 15/85  vessel]
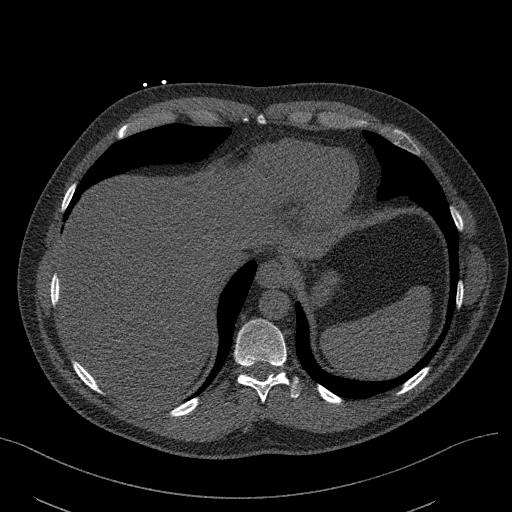
[im 15/85  lung]
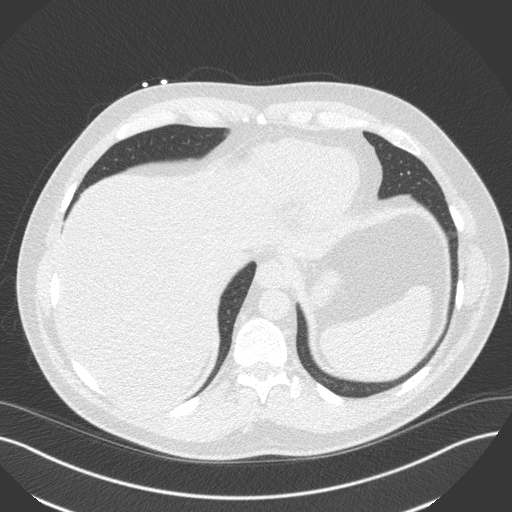
[im 29/85  vessel]
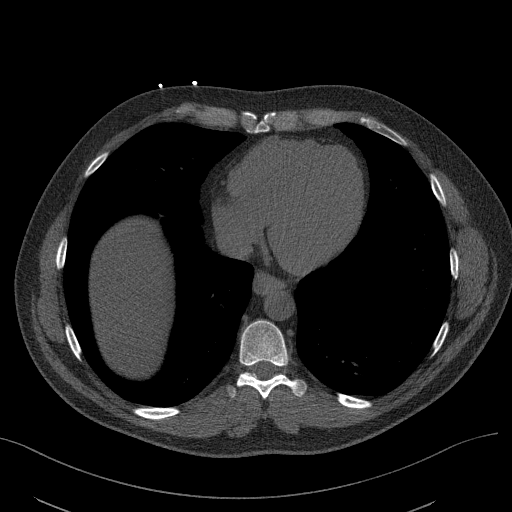
[im 43/85  vessel]
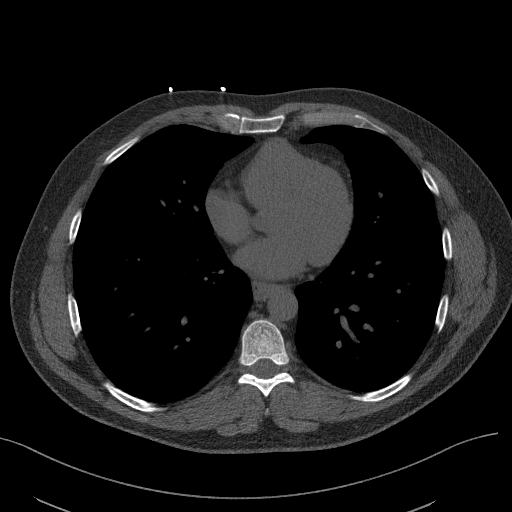
[im 57/85  vessel]
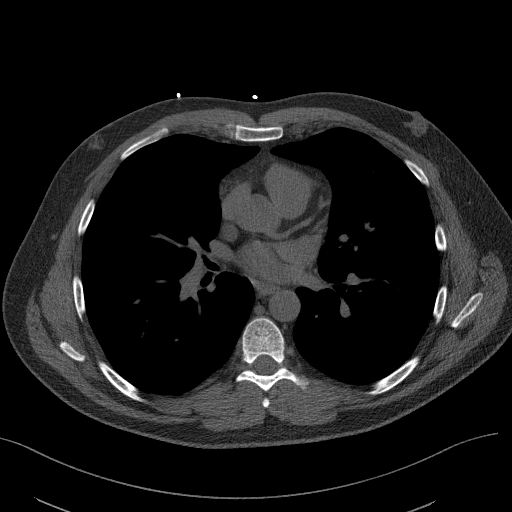
[im 71/85  vessel]
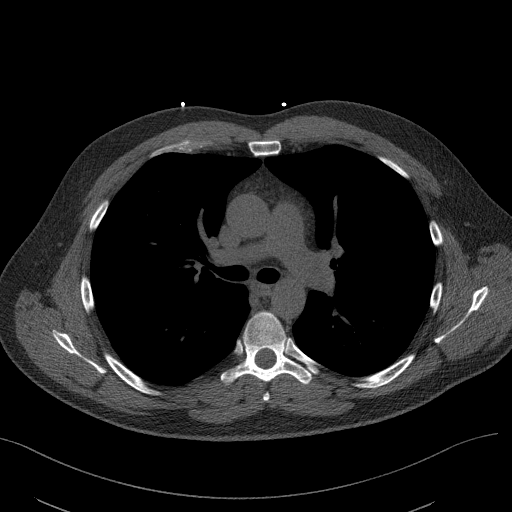
[im 71/85  lung]
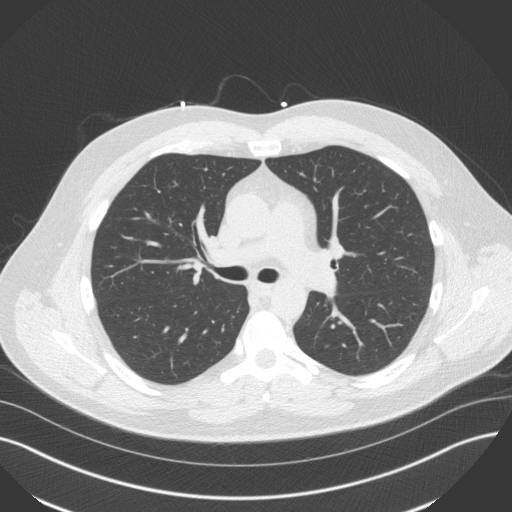

[Series 4: cascseq 2.0 br59 lung · axial · 0.79mm/px · z∈[-213,-101]mm · 5 of 85 slices shown]
[im 15/85  lung]
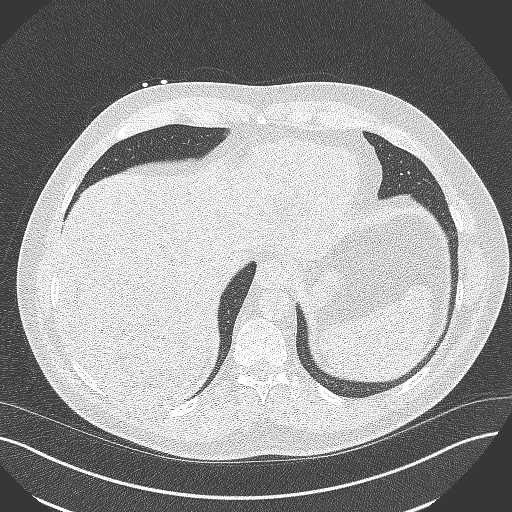
[im 29/85  lung]
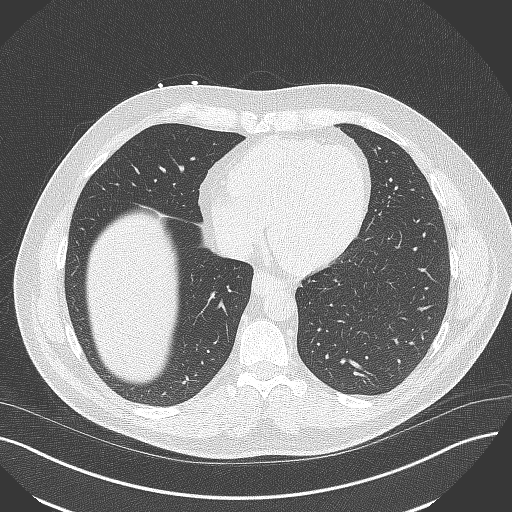
[im 43/85  lung]
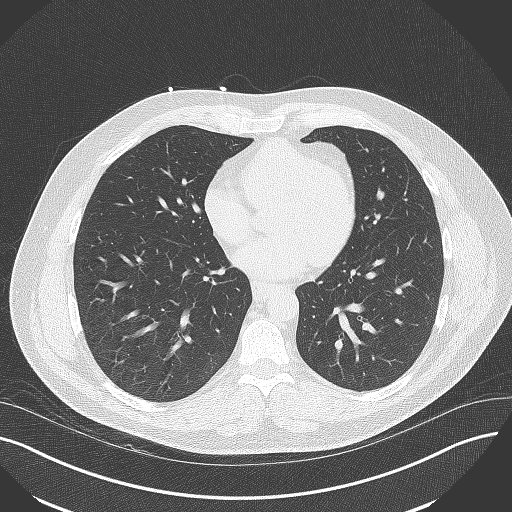
[im 57/85  lung]
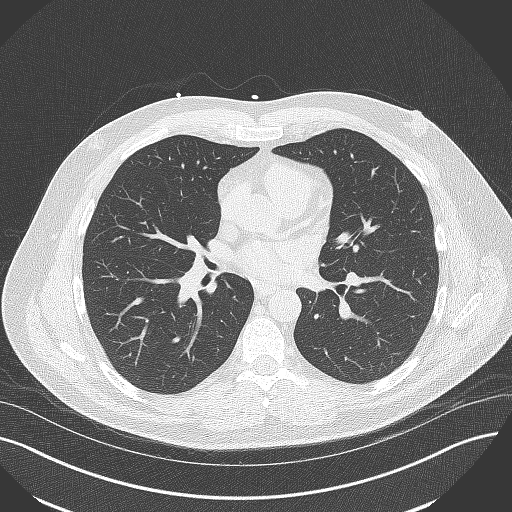
[im 71/85  lung]
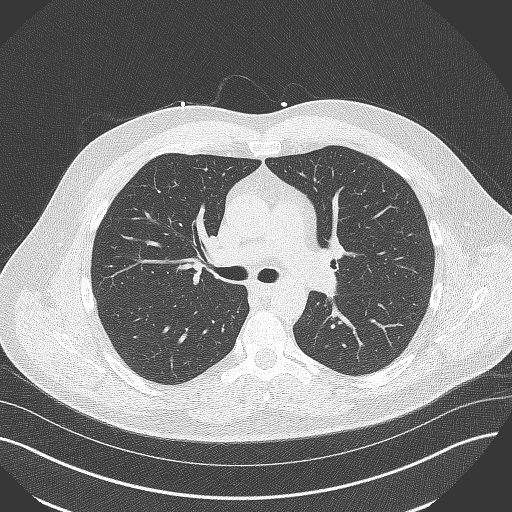

[14 of 20 positions shown; findings below may reference images not displayed]

FINDINGS: Vascular: Normal aortic caliber.

Mediastinum/Nodes: No imaged thoracic adenopathy.

Lungs/Pleura: No pleural fluid.  Clear imaged lungs.

Upper Abdomen: Mild hepatic steatosis. Normal imaged portions of the
spleen, stomach.

Musculoskeletal: No acute osseous abnormality.
IMPRESSION: 1.  No acute findings in the imaged extracardiac chest.
2. Mild hepatic steatosis.
FINDINGS: Coronary arteries: Normal origins.

Coronary Calcium Score:

Left main: 0

Left anterior descending artery: 0

Left circumflex artery: 0

Right coronary artery: 0

Total: 0

Percentile: 0

Pericardium: Normal.

Ascending Aorta: Normal caliber.

Non-cardiac: See separate report from [REDACTED].
IMPRESSION: Coronary calcium score of 0. This was 0 percentile for age-, race-,
and sex-matched controls.



If CAC=0, it is reasonable to withhold statin therapy and reassess
in 5 to 10 years, as long as higher risk conditions are absent
(diabetes mellitus, family history of premature CHD in first degree
relatives (males <55 years; females <65 years), cigarette smoking,
or LDL >=190 mg/dL).

If CAC is 1 to 99, it is reasonable to initiate statin therapy for
patients >=55 years of age.

If CAC is >=100 or >=75th percentile, it is reasonable to initiate
statin therapy at any age.

Cardiology referral should be considered for patients with CAC
scores >=400 or >=75th percentile.

*0428 AHA/ACC/AACVPR/AAPA/ABC/AUSUA/ORFEI/BAM/Mbukeni/ARISSA/DIFUNTORUM/YATSURI
Guideline on the Management of Blood Cholesterol: A Report of the
American College of Cardiology/American Heart Association Task Force
on Clinical Practice Guidelines. J Am Coll Cardiol.
4891;73(24):3490-3512.

*** End of Addendum ***
EXAM:
OVER-READ INTERPRETATION  CT CHEST

The following report is an over-read performed by radiologist Dr.
Alaaeldeen Mahamad [REDACTED] on 07/23/2021. This over-read
does not include interpretation of cardiac or coronary anatomy or
pathology. The calcium score interpretation by the cardiologist is
attached.
FINDINGS: Vascular: Normal aortic caliber.

Mediastinum/Nodes: No imaged thoracic adenopathy.

Lungs/Pleura: No pleural fluid.  Clear imaged lungs.

Upper Abdomen: Mild hepatic steatosis. Normal imaged portions of the
spleen, stomach.

Musculoskeletal: No acute osseous abnormality.
IMPRESSION: 1.  No acute findings in the imaged extracardiac chest.
2. Mild hepatic steatosis.

## 2022-06-21 LAB — HEPATIC FUNCTION PANEL
ALT: 50 IU/L — ABNORMAL HIGH (ref 0–44)
AST: 33 IU/L (ref 0–40)
Albumin: 5.3 g/dL — ABNORMAL HIGH (ref 4.1–5.1)
Alkaline Phosphatase: 93 IU/L (ref 44–121)
Bilirubin Total: 0.8 mg/dL (ref 0.0–1.2)
Bilirubin, Direct: 0.21 mg/dL (ref 0.00–0.40)
Total Protein: 8.1 g/dL (ref 6.0–8.5)

## 2022-06-21 LAB — LIPID PANEL
Chol/HDL Ratio: 5.2 ratio — ABNORMAL HIGH (ref 0.0–5.0)
Cholesterol, Total: 201 mg/dL — ABNORMAL HIGH (ref 100–199)
HDL: 39 mg/dL — ABNORMAL LOW (ref >39)
LDL Chol Calc (NIH): 108 mg/dL — ABNORMAL HIGH (ref 0–99)
Triglycerides: 317 mg/dL — ABNORMAL HIGH (ref 0–149)
VLDL Cholesterol Cal: 54 mg/dL — ABNORMAL HIGH (ref 5–40)

## 2022-06-23 ENCOUNTER — Ambulatory Visit: Payer: 59 | Attending: Internal Medicine | Admitting: Internal Medicine

## 2022-06-23 ENCOUNTER — Encounter: Payer: Self-pay | Admitting: Internal Medicine

## 2022-06-23 VITALS — BP 135/83 | HR 72 | Ht 74.0 in | Wt 235.2 lb

## 2022-06-23 DIAGNOSIS — E781 Pure hyperglyceridemia: Secondary | ICD-10-CM

## 2022-06-23 DIAGNOSIS — K760 Fatty (change of) liver, not elsewhere classified: Secondary | ICD-10-CM | POA: Diagnosis not present

## 2022-06-23 MED ORDER — FENOFIBRATE 160 MG PO TABS
160.0000 mg | ORAL_TABLET | Freq: Every day | ORAL | 3 refills | Status: DC
Start: 2022-06-23 — End: 2023-05-02

## 2022-06-23 MED ORDER — ICOSAPENT ETHYL 1 G PO CAPS: 2.0000 g | ORAL_CAPSULE | Freq: Two times a day (BID) | ORAL | 3 refills | Status: DC

## 2022-06-23 NOTE — Progress Notes (Signed)
LIPID CLINIC CONSULT NOTE  Chief Complaint:  Follow-up dyslipidemia  Primary Care Physician: Mayra Neer, MD  Primary Cardiologist:  None  HPI:  Seth [name] is a 41 y.o. male who is being seen today for the evaluation of dyslipidemia at the request of Mayra Neer, MD. this is a pleasant 41 year old male with a history of dyslipidemia and particular high triglycerides and his mother and maternal grandfather.  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  He has taken this, fish oil and other treatments in the past but recently discontinued them as he felt that they were ineffective.  He also reported itching on rosuvastatin.  Seth [name] has no known coronary disease.  He does have a history of elevated liver enzymes and ultimately had a liver biopsy which showed no fibrosis.  Has been followed by Roosevelt Locks, NP with hepatology in [place].  10/04/2021  Seth [name] returns today for follow-up of dyslipidemia.  He reports has been taking both fenofibrate and Vascepa however insurance would not cover the Vascepa and has been paying out-of-pocket.  Despite this we have seen improvement in his lipids.  Triglycerides have come down to 219 from over 500.  Total cholesterol however has gone up and now 257, HDL 46 and LDL 170.  He has been off of statin therapy.  As previously mentioned, he does have biopsy-proven fatty liver disease without any fibrosis.  He has had some elevated liver enzymes in the past which are expected to worsen somewhat on statin therapy however it is recommended that we trial him on statin therapy to lower his cholesterol.  Fortunately, he was not found to have any coronary calcification on CT however given his young age he may not have had opportunity to calcified his coronaries at this point.  06/23/2022  Seth [name] returns today for follow-up.  He had  recent repeat lipids which show some improvement in his cholesterol.  Total 201, triglycerides 317, HDL 39 and LDL 108.  His LDL was as high as 170 about 9 months ago.  He had repeat liver enzymes which show a stable ALT at 50.  I had decreased his fenofibrate to see if that was contributing to his liver enzyme abnormalities but it did not seem to make a difference.  His triglycerides however are higher.  He also has noted cost issues with Vascepa.  He would like to try to get it through his primary pharmacy.  PMHx:  Past Medical History:  Diagnosis Date   ADHD (attention deficit hyperactivity disorder)    Ankle fracture, right 12/05/2015   Anxiety    Depression    Hyperlipemia    Hypertension    Insomnia     Past Surgical History:  Procedure Laterality Date   ORIF ANKLE FRACTURE Right 12/16/2015   Procedure: OPEN REDUCTION INTERNAL FIXATION (ORIF) RIGHT ANKLE FRACTURE;  Surgeon: [name], MD;  Location: [facility];  Service: Orthopedics;  Laterality: Right;   ORIF CLAVICLE FRACTURE Right     FAMHx:  Family History  Problem Relation Age of Onset   ADD / ADHD Mother    Anxiety disorder Mother    Depression Mother    Anxiety disorder Father    Depression Father    Bipolar disorder Sister    Drug abuse Brother    Drug abuse Brother     SOCHx:   reports  that he quit smoking about 19 years ago. His smoking use included cigarettes. He has never used smokeless tobacco. He reports current alcohol use of about 8.0 standard drinks of alcohol per week. He reports that he does not use drugs.  ALLERGIES:  Allergies  Allergen Reactions   Rosuvastatin Itching   Tramadol Itching    ROS: Pertinent items noted in HPI and remainder of comprehensive ROS otherwise negative.  HOME MEDS: Current Outpatient Medications on File Prior to Visit  Medication Sig Dispense Refill   ALPRAZolam (XANAX) 0.25 MG tablet Take 0.25 mg by mouth 2 (two) times daily. As needed      amLODipine (NORVASC) 10 MG tablet Take 10 mg by mouth daily.     atorvastatin (LIPITOR) 40 MG tablet Take 1 tablet (40 mg total) by mouth daily. 30 tablet 5   desvenlafaxine (PRISTIQ) 100 MG 24 hr tablet Take 1 tablet by mouth daily.     diphenhydrAMINE-zinc acetate (BENADRYL) cream Apply 1 application topically 3 (three) times daily as needed for itching.     ezetimibe (ZETIA) 10 MG tablet Take 1 tablet (10 mg total) by mouth daily. 90 tablet 3   fenofibrate (TRICOR) 48 MG tablet Take 1 tablet (48 mg total) by mouth daily. 90 tablet 3   hydrochlorothiazide (HYDRODIURIL) 25 MG tablet Take 25 mg by mouth daily.     ibuprofen (ADVIL) 200 MG tablet Take 800 mg by mouth every 6 (six) hours as needed for headache or moderate pain.     icosapent Ethyl (VASCEPA) 1 g capsule Take 2 capsules (2 g total) by mouth 2 (two) times daily. 360 capsule 2   lisinopril (ZESTRIL) 40 MG tablet Take 40 mg by mouth daily.     ondansetron (ZOFRAN-ODT) 4 MG disintegrating tablet Take 1 tablet (4 mg total) by mouth every 8 (eight) hours as needed for nausea or vomiting. 20 tablet 0   No current facility-administered medications on file prior to visit.    LABS/IMAGING: No results found for this or any previous visit (from the past 48 hour(s)). No results found.  LIPID PANEL:    Component Value Date/Time   CHOL 201 (H) 06/21/2022 0722   TRIG 317 (H) 06/21/2022 0722   HDL 39 (L) 06/21/2022 0722   CHOLHDL 5.2 (H) 06/21/2022 0722   LDLCALC 108 (H) 06/21/2022 0722   LDLDIRECT 177 (H) 09/22/2021 1320    WEIGHTS: Wt Readings from Last 3 Encounters:  06/23/22 235 lb 3.2 oz (106.7 kg)  12/29/21 237 lb 3.4 oz (107.6 kg)  10/04/21 237 lb 3.2 oz (107.6 kg)    VITALS: BP 135/83   Pulse 72   Ht '6\' 2"'$  (1.88 m)   Wt 235 lb 3.2 oz (106.7 kg)   SpO2 96%   BMI 30.20 kg/m   EXAM: Deferred  EKG: Deferred  ASSESSMENT: Mixed dyslipidemia with high triglycerides Family history of premature coronary  disease Statin and fenofibrate intolerance with elevated liver enzymes NAFLD-biopsy-proven 0 CAC score (07/2021)  PLAN: 1.   Mr. Cease has had improvement in his triglycerides but recently they have increased somewhat with LDL very close to target less than 100.  He had no coronary calcium.  We will go ahead and prescribe his Vascepa to [facility] as he is requested and increase the fenofibrate dose back to 160 mg daily to be sent to [facility].  Will repeat lipid and liver enzymes in about 3 to 4 months and follow-up afterwards.  Pixie Casino, MD, [facility], FACP  [place] Director of the Advanced Lipid Disorders &  Cardiovascular Risk Reduction Clinic Diplomate of the American Board of Clinical Lipidology Attending Cardiologist  Direct Dial: [phone]  Fax: [phone]  Website:  [email or web address] 06/23/2022, 3:51 PM

## 2022-06-23 NOTE — Patient Instructions (Signed)
Medication Instructions:  INCREASE fenofibrate to '160mg'$  daily -- sent to [facility] Vascepa prescription -- sent to [facility]  *If you need a refill on your cardiac medications before your next appointment, please call your pharmacy*   Lab Work: FASTING lab work in about 4 months   If you have labs (blood work) drawn today and your tests are completely normal, you will receive your results only by: [employer] (if you have MyChart) OR A paper copy in the mail If you have any lab test that is abnormal or we need to change your treatment, we will call you to review the results.    Follow-Up: At [facility] [place], you and your health needs are our priority.  As part of our continuing mission to provide you with exceptional heart care, we have created designated Provider Care Teams.  These Care Teams include your primary Cardiologist (physician) and Advanced Practice Providers (APPs -  Physician Assistants and Nurse Practitioners) who all work together to provide you with the care you need, when you need it.  We recommend signing up for the patient portal called "MyChart".  Sign up information is provided on this After Visit Summary.  MyChart is used to connect with patients for Virtual Visits (Telemedicine).  Patients are able to view lab/test results, encounter notes, upcoming appointments, etc.  Non-urgent messages can be sent to your provider as well.   To learn more about what you can do with MyChart, go to [email or web address].    Your next appointment:   4 month(s)  The format for your next appointment:   In Person  Provider:   Lyman Bishop MD

## 2022-07-21 ENCOUNTER — Other Ambulatory Visit: Payer: Self-pay

## 2022-07-21 MED ORDER — EZETIMIBE 10 MG PO TABS: 10.0000 mg | ORAL_TABLET | Freq: Every day | ORAL | 3 refills | Status: DC

## 2022-10-18 LAB — LAB REPORT - SCANNED
A1c: 5.6
Albumin, Urine POC: 1.15
Creatinine, POC: 238 mg/dL
EGFR: 115
Microalb Creat Ratio: 4.8

## 2022-10-20 ENCOUNTER — Encounter: Payer: Self-pay | Admitting: Internal Medicine

## 2022-10-20 MED ORDER — ATORVASTATIN CALCIUM 40 MG PO TABS: 40.0000 mg | ORAL_TABLET | Freq: Every day | ORAL | 3 refills | Status: DC

## 2022-10-21 ENCOUNTER — Other Ambulatory Visit: Payer: Self-pay | Admitting: *Deleted

## 2022-11-14 ENCOUNTER — Encounter: Payer: Self-pay | Admitting: Internal Medicine

## 2022-11-17 ENCOUNTER — Other Ambulatory Visit: Payer: Self-pay

## 2022-11-17 ENCOUNTER — Ambulatory Visit: Payer: 59 | Attending: Internal Medicine | Admitting: Internal Medicine

## 2022-11-17 ENCOUNTER — Encounter: Payer: Self-pay | Admitting: Internal Medicine

## 2022-11-17 VITALS — BP 124/84 | HR 74 | Ht 74.0 in | Wt 234.6 lb

## 2022-11-17 DIAGNOSIS — K760 Fatty (change of) liver, not elsewhere classified: Secondary | ICD-10-CM | POA: Diagnosis not present

## 2022-11-17 DIAGNOSIS — E781 Pure hyperglyceridemia: Secondary | ICD-10-CM | POA: Diagnosis not present

## 2022-11-17 NOTE — Patient Instructions (Signed)
Medication Instructions:  Continue same medications *If you need a refill on your cardiac medications before your next appointment, please call your pharmacy*   Lab Work: Lipid panel to be done 1 week before 11/2023 appointment    Testing/Procedures: None ordered   Follow-Up: At [facility], you and your health needs are our priority.  As part of our continuing mission to provide you with exceptional heart care, we have created designated Provider Care Teams.  These Care Teams include your primary Cardiologist (physician) and Advanced Practice Providers (APPs -  Physician Assistants and Nurse Practitioners) who all work together to provide you with the care you need, when you need it.  We recommend signing up for the patient portal called "MyChart".  Sign up information is provided on this After Visit Summary.  MyChart is used to connect with patients for Virtual Visits (Telemedicine).  Patients are able to view lab/test results, encounter notes, upcoming appointments, etc.  Non-urgent messages can be sent to your provider as well.   To learn more about what you can do with MyChart, go to [email or web address].    Your next appointment:  1 year     Call in Feb to schedule May appointment     Provider:  Dr.Hilty

## 2022-11-17 NOTE — Progress Notes (Addendum)
LIPID CLINIC CONSULT NOTE  Chief Complaint:  Follow-up dyslipidemia  Primary Care Physician: Lupita Raider, MD  Primary Cardiologist:  None  HPI:  Seth [name] is a 42 y.o. male who is being seen today for the evaluation of dyslipidemia at the request of Lupita Raider, MD. this is a pleasant 42 year old male with a history of dyslipidemia and particular high triglycerides and his mother and maternal grandfather.  Seth [name] works as a Horticulturist, commercial.  Last lipids in March 2022 showed total cholesterol 228, HDL 29, LDL 104 and triglycerides 161.  He has taken this, fish oil and other treatments in the past but recently discontinued them as he felt that they were ineffective.  He also reported itching on rosuvastatin.  Decardenas has no known coronary disease.  He does have a history of elevated liver enzymes and ultimately had a liver biopsy which showed no fibrosis.  Has been followed by Annamarie Major, NP with hepatology in [place].  10/04/2021  Seth [name] returns today for follow-up of dyslipidemia.  He reports has been taking both fenofibrate and Vascepa however insurance would not cover the Vascepa and has been paying out-of-pocket.  Despite this we have seen improvement in his lipids.  Triglycerides have come down to 219 from over 500.  Total cholesterol however has gone up and now 257, HDL 46 and LDL 170.  He has been off of statin therapy.  As previously mentioned, he does have biopsy-proven fatty liver disease without any fibrosis.  He has had some elevated liver enzymes in the past which are expected to worsen somewhat on statin therapy however it is recommended that we trial him on statin therapy to lower his cholesterol.  Fortunately, he was not found to have any coronary calcification on CT however given his young age he may not have had opportunity to calcified his coronaries at this point.  06/23/2022  Seth [name] returns today for follow-up.  He had  recent repeat lipids which show some improvement in his cholesterol.  Total 201, triglycerides 317, HDL 39 and LDL 108.  His LDL was as high as 170 about 9 months ago.  He had repeat liver enzymes which show a stable ALT at 50.  I had decreased his fenofibrate to see if that was contributing to his liver enzyme abnormalities but it did not seem to make a difference.  His triglycerides however are higher.  He also has noted cost issues with Vascepa.  He would like to try to get it through his primary pharmacy.  11/17/2022  Seth [name] returns today for follow-up. He had repeat lipids drawn at his PCP office. Triglycerides are significantly improved down to 161 (which is in normal limits for the lab parameters), from 553, total cholesterol 163, HDL 42 and LDL 93.  His target LDL less than 100.  PMHx:  Past Medical History:  Diagnosis Date   ADHD (attention deficit hyperactivity disorder)    Ankle fracture, right 12/05/2015   Anxiety    Depression    Hyperlipemia    Hypertension    Insomnia     Past Surgical History:  Procedure Laterality Date   ORIF ANKLE FRACTURE Right 12/16/2015   Procedure: OPEN REDUCTION INTERNAL FIXATION (ORIF) RIGHT ANKLE FRACTURE;  Surgeon: Tarry Kos, MD;  Location: [facility];  Service: Orthopedics;  Laterality: Right;   ORIF CLAVICLE FRACTURE Right     FAMHx:  Family History  Problem Relation Age of Onset   ADD /  ADHD Mother    Anxiety disorder Mother    Depression Mother    Anxiety disorder Father    Depression Father    Bipolar disorder Sister    Drug abuse Brother    Drug abuse Brother     SOCHx:   reports that he quit smoking about 20 years ago. His smoking use included cigarettes. He has never used smokeless tobacco. He reports current alcohol use of about 8.0 standard drinks of alcohol per week. He reports that he does not use drugs.  ALLERGIES:  Allergies  Allergen Reactions   Rosuvastatin Itching   Tramadol Itching     ROS: Pertinent items noted in HPI and remainder of comprehensive ROS otherwise negative.  HOME MEDS: Current Outpatient Medications on File Prior to Visit  Medication Sig Dispense Refill   ALPRAZolam (XANAX) 0.25 MG tablet Take 0.25 mg by mouth 2 (two) times daily. As needed     amLODipine (NORVASC) 10 MG tablet Take 10 mg by mouth daily.     atorvastatin (LIPITOR) 40 MG tablet Take 1 tablet (40 mg total) by mouth daily. 90 tablet 3   diphenhydrAMINE-zinc acetate (BENADRYL) cream Apply 1 application topically 3 (three) times daily as needed for itching.     ezetimibe (ZETIA) 10 MG tablet Take 1 tablet (10 mg total) by mouth daily. 90 tablet 3   fenofibrate 160 MG tablet Take 1 tablet (160 mg total) by mouth daily. 90 tablet 3   hydrochlorothiazide (HYDRODIURIL) 25 MG tablet Take 25 mg by mouth daily.     icosapent Ethyl (VASCEPA) 1 g capsule Take 2 capsules (2 g total) by mouth 2 (two) times daily. 360 capsule 3   lisinopril (ZESTRIL) 40 MG tablet Take 40 mg by mouth daily.     desvenlafaxine (PRISTIQ) 100 MG 24 hr tablet Take 1 tablet by mouth daily. (Patient not taking: Reported on 11/17/2022)     ibuprofen (ADVIL) 200 MG tablet Take 800 mg by mouth every 6 (six) hours as needed for headache or moderate pain. (Patient not taking: Reported on 11/17/2022)     ondansetron (ZOFRAN-ODT) 4 MG disintegrating tablet Take 1 tablet (4 mg total) by mouth every 8 (eight) hours as needed for nausea or vomiting. (Patient not taking: Reported on 11/17/2022) 20 tablet 0   No current facility-administered medications on file prior to visit.    LABS/IMAGING: No results found for this or any previous visit (from the past 48 hour(s)). No results found.  LIPID PANEL:    Component Value Date/Time   CHOL 201 (H) 06/21/2022 0722   TRIG 317 (H) 06/21/2022 0722   HDL 39 (L) 06/21/2022 0722   CHOLHDL 5.2 (H) 06/21/2022 0722   LDLCALC 108 (H) 06/21/2022 0722   LDLDIRECT 177 (H) 09/22/2021 1320     WEIGHTS: Wt Readings from Last 3 Encounters:  11/17/22 234 lb 9.6 oz (106.4 kg)  06/23/22 235 lb 3.2 oz (106.7 kg)  12/29/21 237 lb 3.4 oz (107.6 kg)    VITALS: BP 124/84   Pulse 74   Ht 6\' 2"  (1.88 m)   Wt 234 lb 9.6 oz (106.4 kg)   SpO2 95%   BMI 30.12 kg/m   EXAM: Deferred  EKG: Normal sinus rhythm at 74- personally reviewed  ASSESSMENT: Mixed dyslipidemia with high triglycerides Family history of premature coronary disease Statin and fenofibrate intolerance with elevated liver enzymes NAFLD-biopsy-proven 0 CAC score (07/2021)  PLAN: 1.   Seth [name] is doing very well on combination therapy.  His  triglycerides are now normalized and LDL is less than 100.  Overall he is tolerating the medications.  I would recommend continuing current therapies.  I am happy to see him back annually or sooner as necessary.  Chrystie Nose, MD, [facility], FACP  [place]  [facility]  Medical Director of the Advanced Lipid Disorders &  Cardiovascular Risk Reduction Clinic Diplomate of the American Board of Clinical Lipidology Attending Cardiologist  Direct Dial: [phone]  Fax: [phone]  Website:  [email or web address] 11/17/2022, 9:38 AM

## 2023-04-26 ENCOUNTER — Other Ambulatory Visit: Payer: Self-pay | Admitting: Internal Medicine

## 2023-05-02 ENCOUNTER — Other Ambulatory Visit: Payer: Self-pay | Admitting: Internal Medicine

## 2023-07-08 ENCOUNTER — Other Ambulatory Visit: Payer: Self-pay | Admitting: Internal Medicine

## 2023-07-15 ENCOUNTER — Other Ambulatory Visit: Payer: Self-pay | Admitting: Internal Medicine

## 2023-07-17 ENCOUNTER — Other Ambulatory Visit: Payer: Self-pay | Admitting: Internal Medicine

## 2023-07-19 NOTE — Telephone Encounter (Signed)
 Pharmacy is requesting a PA for this medication.

## 2023-07-21 ENCOUNTER — Other Ambulatory Visit (HOSPITAL_COMMUNITY): Payer: Self-pay

## 2023-07-21 ENCOUNTER — Telehealth: Payer: Self-pay | Admitting: Pharmacy Technician

## 2023-07-21 NOTE — Telephone Encounter (Signed)
Pharmacy Patient Advocate Encounter   Received notification from Patient Advice Request messages that prior authorization for Icosapent Ethyl 1GM capsules is required/requested.   Insurance verification completed.   The patient is insured through [facility] .   Per test claim: PA required; PA submitted to above mentioned insurance via CoverMyMeds Key/confirmation #/EOC [id number] Status is pending

## 2023-07-25 ENCOUNTER — Telehealth: Payer: Self-pay | Admitting: Pharmacy Technician

## 2023-07-25 NOTE — Telephone Encounter (Signed)
PA request has been Denied. New Encounter created for follow up. For additional info see Pharmacy Prior Auth telephone encounter from 07/25/23.

## 2023-07-25 NOTE — Telephone Encounter (Signed)
Pharmacy Patient Advocate Encounter  Received notification from [facility] that Prior Authorization for Icosapent Ethyl 1GM capsules has been DENIED.  See denial reason below. No denial letter attached in CMM. Will attach denial letter to Media tab once received.   PA #/Case ID/Reference #: [id number]

## 2023-07-26 ENCOUNTER — Other Ambulatory Visit (HOSPITAL_COMMUNITY): Payer: Self-pay

## 2023-07-26 NOTE — Telephone Encounter (Addendum)
Can someone help with this one? Due to the denial we would need assistance with the appeal. I will scan the full denial once it is fax. This is a snippet of the appeal

## 2023-07-31 ENCOUNTER — Other Ambulatory Visit (HOSPITAL_COMMUNITY): Payer: Self-pay

## 2023-07-31 ENCOUNTER — Telehealth: Payer: Self-pay | Admitting: Pharmacy Technician

## 2023-07-31 NOTE — Telephone Encounter (Signed)
Appeals letter composed. Message sent to pharmacy prior auth team

## 2023-07-31 NOTE — Telephone Encounter (Signed)
PA appeal request has been Submitted. Faxed insurance appeal letter 07/31/23

## 2023-07-31 NOTE — Telephone Encounter (Signed)
PA appeal request has been Submitted. New Encounter created for follow up. For additional info see Pharmacy Prior Auth telephone encounter from 07/31/23.   Faxed insurance appeal letter

## 2023-08-02 ENCOUNTER — Telehealth: Payer: Self-pay | Admitting: Pharmacy Technician

## 2023-08-02 ENCOUNTER — Other Ambulatory Visit (HOSPITAL_COMMUNITY): Payer: Self-pay

## 2023-08-02 NOTE — Telephone Encounter (Signed)
Received a note to follow up on appeal. I just called and they said we should hear a decision by 08/15/23  [phone] [facility] for appeal Fax appeal [phone] Case [id number]

## 2023-08-04 ENCOUNTER — Other Ambulatory Visit (HOSPITAL_COMMUNITY): Payer: Self-pay

## 2023-08-11 ENCOUNTER — Other Ambulatory Visit (HOSPITAL_COMMUNITY): Payer: Self-pay

## 2023-08-11 NOTE — Telephone Encounter (Signed)
 Pharmacy Patient Advocate Encounter  Appeal for icosapent  Ethyl 1 g capsule has been APPROVED from 08/05/23 to 08/04/24. Spoke to pharmacy to process.Copay is $50.00.

## 2023-08-14 NOTE — Telephone Encounter (Signed)
 Copied from another phone note encounter  Pharmacy Patient Advocate Encounter   Appeal for icosapent  Ethyl 1 g capsule has been APPROVED from 08/05/23 to 08/04/24. Spoke to pharmacy to process.Copay is $50.00.   Update sent to patient via MyChart

## 2023-09-24 ENCOUNTER — Other Ambulatory Visit: Payer: Self-pay | Admitting: Internal Medicine

## 2023-10-30 LAB — LAB REPORT - SCANNED
Creatinine, POC: 110 mg/dL
EGFR: 115
Microalb Creat Ratio: <6.3
Microalbumin, Urine: <0.7

## 2024-02-04 ENCOUNTER — Other Ambulatory Visit: Payer: Self-pay | Admitting: Internal Medicine

## 2024-06-30 ENCOUNTER — Other Ambulatory Visit: Payer: Self-pay | Admitting: Internal Medicine

## 2024-08-02 ENCOUNTER — Other Ambulatory Visit: Payer: Self-pay | Admitting: Internal Medicine

## 2024-08-06 NOTE — Telephone Encounter (Signed)
 PATIENT MUST MAKE AN APPOINTMENT IN ORDER TO RECEIVE ADDITIONAL REFILLS, 2ND ATTEMPT.
